# Patient Record
Sex: Female | Born: 1935 | Race: White | Hispanic: No | Smoking: Never smoker
Health system: Southern US, Community
[De-identification: ages and names within clinical notes are randomized; demographics above are authoritative.]

## PROBLEM LIST (undated history)

## (undated) DIAGNOSIS — E119 Type 2 diabetes mellitus without complications: Secondary | ICD-10-CM

---

## 2014-05-13 ENCOUNTER — Ambulatory Visit: Payer: Self-pay | Admitting: Family Medicine

## 2014-05-17 ENCOUNTER — Other Ambulatory Visit: Payer: Self-pay | Admitting: Family Medicine

## 2014-05-17 DIAGNOSIS — R1310 Dysphagia, unspecified: Secondary | ICD-10-CM

## 2014-05-20 ENCOUNTER — Other Ambulatory Visit: Payer: Self-pay

## 2014-05-22 ENCOUNTER — Ambulatory Visit
Admission: RE | Admit: 2014-05-22 | Discharge: 2014-05-22 | Disposition: A | Discharge status: Home / Self Care | Payer: Commercial Managed Care - HMO | Source: Ambulatory Visit | Attending: Family Medicine | Admitting: Family Medicine

## 2014-05-22 DIAGNOSIS — R1310 Dysphagia, unspecified: Secondary | ICD-10-CM

## 2016-07-20 ENCOUNTER — Ambulatory Visit: Payer: Commercial Managed Care - HMO | Admitting: Adult Health

## 2016-07-23 ENCOUNTER — Ambulatory Visit: Payer: Commercial Managed Care - HMO | Admitting: Adult Health

## 2019-11-30 ENCOUNTER — Encounter: Payer: Self-pay | Admitting: *Deleted

## 2019-11-30 ENCOUNTER — Observation Stay (HOSPITAL_COMMUNITY): Payer: Medicare HMO

## 2019-11-30 ENCOUNTER — Emergency Department (HOSPITAL_COMMUNITY): Payer: Medicare HMO

## 2019-11-30 ENCOUNTER — Inpatient Hospital Stay (HOSPITAL_COMMUNITY)
Admission: EM | Admit: 2019-11-30 | Discharge: 2019-12-08 | DRG: 177 | Disposition: A | Discharge status: Home Health | Payer: Medicare HMO | Attending: Internal Medicine | Admitting: Internal Medicine

## 2019-11-30 DIAGNOSIS — E860 Dehydration: Secondary | ICD-10-CM | POA: Diagnosis present

## 2019-11-30 DIAGNOSIS — B370 Candidal stomatitis: Secondary | ICD-10-CM | POA: Diagnosis not present

## 2019-11-30 DIAGNOSIS — E441 Mild protein-calorie malnutrition: Secondary | ICD-10-CM | POA: Diagnosis present

## 2019-11-30 DIAGNOSIS — E871 Hypo-osmolality and hyponatremia: Secondary | ICD-10-CM

## 2019-11-30 DIAGNOSIS — U071 COVID-19: Secondary | ICD-10-CM | POA: Diagnosis not present

## 2019-11-30 DIAGNOSIS — E785 Hyperlipidemia, unspecified: Secondary | ICD-10-CM | POA: Diagnosis present

## 2019-11-30 DIAGNOSIS — G4700 Insomnia, unspecified: Secondary | ICD-10-CM | POA: Diagnosis present

## 2019-11-30 DIAGNOSIS — S065X9A Traumatic subdural hemorrhage with loss of consciousness of unspecified duration, initial encounter: Secondary | ICD-10-CM

## 2019-11-30 DIAGNOSIS — D6489 Other specified anemias: Secondary | ICD-10-CM | POA: Diagnosis not present

## 2019-11-30 DIAGNOSIS — R627 Adult failure to thrive: Secondary | ICD-10-CM

## 2019-11-30 DIAGNOSIS — Y92003 Bedroom of unspecified non-institutional (private) residence as the place of occurrence of the external cause: Secondary | ICD-10-CM

## 2019-11-30 DIAGNOSIS — R29898 Other symptoms and signs involving the musculoskeletal system: Secondary | ICD-10-CM

## 2019-11-30 DIAGNOSIS — N179 Acute kidney failure, unspecified: Secondary | ICD-10-CM | POA: Diagnosis present

## 2019-11-30 DIAGNOSIS — E876 Hypokalemia: Secondary | ICD-10-CM | POA: Diagnosis not present

## 2019-11-30 DIAGNOSIS — D6959 Other secondary thrombocytopenia: Secondary | ICD-10-CM | POA: Diagnosis not present

## 2019-11-30 DIAGNOSIS — R634 Abnormal weight loss: Secondary | ICD-10-CM

## 2019-11-30 DIAGNOSIS — R630 Anorexia: Secondary | ICD-10-CM

## 2019-11-30 DIAGNOSIS — E119 Type 2 diabetes mellitus without complications: Secondary | ICD-10-CM

## 2019-11-30 DIAGNOSIS — S065X0A Traumatic subdural hemorrhage without loss of consciousness, initial encounter: Secondary | ICD-10-CM | POA: Diagnosis present

## 2019-11-30 DIAGNOSIS — K117 Disturbances of salivary secretion: Secondary | ICD-10-CM

## 2019-11-30 DIAGNOSIS — I6200 Nontraumatic subdural hemorrhage, unspecified: Secondary | ICD-10-CM

## 2019-11-30 DIAGNOSIS — Z7189 Other specified counseling: Secondary | ICD-10-CM

## 2019-11-30 DIAGNOSIS — M6281 Muscle weakness (generalized): Secondary | ICD-10-CM | POA: Diagnosis present

## 2019-11-30 DIAGNOSIS — I10 Essential (primary) hypertension: Secondary | ICD-10-CM | POA: Diagnosis present

## 2019-11-30 DIAGNOSIS — Z7984 Long term (current) use of oral hypoglycemic drugs: Secondary | ICD-10-CM

## 2019-11-30 DIAGNOSIS — E872 Acidosis: Secondary | ICD-10-CM | POA: Diagnosis present

## 2019-11-30 DIAGNOSIS — E11649 Type 2 diabetes mellitus with hypoglycemia without coma: Secondary | ICD-10-CM | POA: Diagnosis not present

## 2019-11-30 DIAGNOSIS — W06XXXA Fall from bed, initial encounter: Secondary | ICD-10-CM | POA: Diagnosis present

## 2019-11-30 DIAGNOSIS — Z79899 Other long term (current) drug therapy: Secondary | ICD-10-CM

## 2019-11-30 DIAGNOSIS — S065XAA Traumatic subdural hemorrhage with loss of consciousness status unknown, initial encounter: Secondary | ICD-10-CM

## 2019-11-30 DIAGNOSIS — Z515 Encounter for palliative care: Secondary | ICD-10-CM

## 2019-11-30 DIAGNOSIS — H9190 Unspecified hearing loss, unspecified ear: Secondary | ICD-10-CM | POA: Diagnosis present

## 2019-11-30 HISTORY — DX: Type 2 diabetes mellitus without complications: E11.9

## 2019-11-30 LAB — POCT I-STAT EG7
Acid-base deficit: 5 mmol/L — ABNORMAL HIGH (ref 0.0–2.0)
Bicarbonate: 21 mmol/L (ref 20.0–28.0)
Calcium, Ion: 1.08 mmol/L — ABNORMAL LOW (ref 1.15–1.40)
HCT: 38 % (ref 36.0–46.0)
Hemoglobin: 12.9 g/dL (ref 12.0–15.0)
O2 Saturation: 69 %
Potassium: 5 mmol/L (ref 3.5–5.1)
Sodium: 131 mmol/L — ABNORMAL LOW (ref 135–145)
TCO2: 22 mmol/L (ref 22–32)
pCO2, Ven: 41.1 mmHg — ABNORMAL LOW (ref 44.0–60.0)
pH, Ven: 7.316 (ref 7.250–7.430)
pO2, Ven: 39 mmHg (ref 32.0–45.0)

## 2019-11-30 LAB — COMPREHENSIVE METABOLIC PANEL
ALT: 11 U/L (ref 0–44)
AST: 22 U/L (ref 15–41)
Albumin: 3.7 g/dL (ref 3.5–5.0)
Alkaline Phosphatase: 54 U/L (ref 38–126)
Anion gap: 16 — ABNORMAL HIGH (ref 5–15)
BUN: 40 mg/dL — ABNORMAL HIGH (ref 8–23)
CO2: 20 mmol/L — ABNORMAL LOW (ref 22–32)
Calcium: 9.2 mg/dL (ref 8.9–10.3)
Chloride: 97 mmol/L — ABNORMAL LOW (ref 98–111)
Creatinine, Ser: 1.07 mg/dL — ABNORMAL HIGH (ref 0.44–1.00)
GFR calc Af Amer: 54 mL/min — ABNORMAL LOW (ref >60)
GFR calc non Af Amer: 47 mL/min — ABNORMAL LOW (ref >60)
Glucose, Bld: 102 mg/dL — ABNORMAL HIGH (ref 70–99)
Potassium: 5.1 mmol/L (ref 3.5–5.1)
Sodium: 133 mmol/L — ABNORMAL LOW (ref 135–145)
Total Bilirubin: 0.8 mg/dL (ref 0.3–1.2)
Total Protein: 7.5 g/dL (ref 6.5–8.1)

## 2019-11-30 LAB — URINALYSIS, ROUTINE W REFLEX MICROSCOPIC
Bacteria, UA: NONE SEEN
Bilirubin Urine: NEGATIVE
Glucose, UA: >=500 mg/dL — AB
Hgb urine dipstick: NEGATIVE
Ketones, ur: 5 mg/dL — AB
Leukocytes,Ua: NEGATIVE
Nitrite: NEGATIVE
Protein, ur: NEGATIVE mg/dL
Specific Gravity, Urine: 1.016 (ref 1.005–1.030)
pH: 5 (ref 5.0–8.0)

## 2019-11-30 LAB — LACTIC ACID, PLASMA: Lactic Acid, Venous: 1.3 mmol/L (ref 0.5–1.9)

## 2019-11-30 LAB — CBC WITH DIFFERENTIAL/PLATELET
Abs Immature Granulocytes: 0.01 10*3/uL (ref 0.00–0.07)
Basophils Absolute: 0 10*3/uL (ref 0.0–0.1)
Basophils Relative: 0 %
Eosinophils Absolute: 0 10*3/uL (ref 0.0–0.5)
Eosinophils Relative: 0 %
HCT: 41.6 % (ref 36.0–46.0)
Hemoglobin: 13.7 g/dL (ref 12.0–15.0)
Immature Granulocytes: 0 %
Lymphocytes Relative: 12 %
Lymphs Abs: 0.5 10*3/uL — ABNORMAL LOW (ref 0.7–4.0)
MCH: 29.5 pg (ref 26.0–34.0)
MCHC: 32.9 g/dL (ref 30.0–36.0)
MCV: 89.5 fL (ref 80.0–100.0)
Monocytes Absolute: 0.4 10*3/uL (ref 0.1–1.0)
Monocytes Relative: 10 %
Neutro Abs: 3.3 10*3/uL (ref 1.7–7.7)
Neutrophils Relative %: 78 %
Platelets: 163 10*3/uL (ref 150–400)
RBC: 4.65 MIL/uL (ref 3.87–5.11)
RDW: 12.8 % (ref 11.5–15.5)
WBC: 4.3 10*3/uL (ref 4.0–10.5)
nRBC: 0 % (ref 0.0–0.2)

## 2019-11-30 LAB — CBG MONITORING, ED: Glucose-Capillary: 100 mg/dL — ABNORMAL HIGH (ref 70–99)

## 2019-11-30 LAB — LIPASE, BLOOD: Lipase: 36 U/L (ref 11–51)

## 2019-11-30 MED ORDER — ENALAPRIL MALEATE 5 MG PO TABS
10.0000 mg | ORAL_TABLET | Freq: Every day | ORAL | Status: DC
  Administered 2019-12-01 – 2019-12-08 (×8): 10 mg via ORAL
  Filled 2019-11-30 (×9): qty 2

## 2019-11-30 MED ORDER — IOHEXOL 350 MG/ML SOLN
100.0000 mL | Freq: Once | INTRAVENOUS | Status: AC | PRN
  Administered 2019-11-30: 100 mL via INTRAVENOUS

## 2019-11-30 MED ORDER — GEMFIBROZIL 600 MG PO TABS
600.0000 mg | ORAL_TABLET | Freq: Two times a day (BID) | ORAL | Status: DC
  Administered 2019-12-01 – 2019-12-08 (×16): 600 mg via ORAL
  Filled 2019-11-30 (×18): qty 1

## 2019-11-30 MED ORDER — MONTELUKAST SODIUM 10 MG PO TABS
10.0000 mg | ORAL_TABLET | Freq: Every day | ORAL | Status: DC
  Administered 2019-12-01 – 2019-12-07 (×8): 10 mg via ORAL
  Filled 2019-11-30 (×9): qty 1

## 2019-11-30 MED ORDER — LACTATED RINGERS IV BOLUS
1000.0000 mL | Freq: Once | INTRAVENOUS | Status: AC
  Administered 2019-11-30: 1000 mL via INTRAVENOUS

## 2019-11-30 MED ORDER — PRAVASTATIN SODIUM 40 MG PO TABS
80.0000 mg | ORAL_TABLET | Freq: Every day | ORAL | Status: DC
  Administered 2019-12-01 – 2019-12-07 (×6): 80 mg via ORAL
  Filled 2019-11-30 (×7): qty 2

## 2019-11-30 MED ORDER — SODIUM CHLORIDE 0.9 % IV SOLN: INTRAVENOUS | Status: DC

## 2019-11-30 MED ORDER — ONDANSETRON HCL 4 MG/2ML IJ SOLN
4.0000 mg | Freq: Once | INTRAMUSCULAR | Status: DC
  Filled 2019-11-30: qty 2

## 2019-11-30 NOTE — ED Triage Notes (Signed)
Pt states that she is feeling sick and has been sick for 3 weeks.  She states that she can not eat, denies any vomiting. Pt states that she has daily BM. When asked if it hurts when she voids she says "yes".  She deneis any abdominal pain.  Attempt to call her daughter to get additional information, no answer.

## 2019-11-30 NOTE — H&P (Signed)
History and Physical    Mckenzie Rodriguez ZOX:[id number] DOB: 11/09/1931 DOA: 11/30/2019  Patient coming from: home   Chief Complaint: unintentional weight loss  HPI: Mckenzie Rodriguez is a 86 y.o. female with medical history significant for t2dm presents with above.  Bulk of history obtained from patient's daughter. Patient lives alone. For at least 3 weeks she has complained of food not tasting good, tasting bitter. Such that she does not want to eat or drink much. Has lost around 10 pounds. No pain with swallowing or difficulty swallowing. No pain anywhere, no fevers or cough. No dysuria or hematuria. Thinks had a normal colonoscopy about 10 years ago. No blood in stool. Fell off bed about a week ago and bumped head. No lesion.  Saw new PCP a few days ago, prescribed mirtazapine for appetite and insomnia.   ED Course: ct head found small subdural hematoma, neurosurg consulted. Labs.  Review of Systems: As per HPI otherwise 10 point review of systems negative.    Past Medical History:  Diagnosis Date  . Diabetes mellitus without complication (HCC)     History reviewed. No pertinent surgical history.   reports that she does not drink alcohol or use drugs. No history on file for tobacco.  No Known Allergies  No family history on file.  Prior to Admission medications   Medication Sig Start Date End Date Taking? Authorizing Provider  enalapril (VASOTEC) 10 MG tablet Take 10 mg by mouth daily. 09/12/19  Yes [provider]  gemfibrozil (LOPID) 600 MG tablet Take 600 mg by mouth 2 (two) times daily. 11/21/19  Yes [provider]  INVOKANA 100 MG TABS tablet Take 100 mg by mouth daily. 09/12/19  Yes [provider]  metFORMIN (GLUCOPHAGE) 500 MG tablet Take 500 mg by mouth 2 (two) times daily. 10/12/19  Yes [provider]  mirtazapine (REMERON SOL-TAB) 15 MG disintegrating tablet Take 15 mg by mouth at bedtime. 11/28/19  Yes [provider]    montelukast (SINGULAIR) 10 MG tablet Take 10 mg by mouth at bedtime. 10/12/19  Yes [provider]  pravastatin (PRAVACHOL) 80 MG tablet Take 80 mg by mouth daily. 09/18/19  Yes [provider]    Physical Exam: Vitals:   11/30/19 1715 11/30/19 1850 11/30/19 1859 11/30/19 1900  BP: 123/68 (!) 142/83  (!) 147/80  Pulse:  (!) 104  (!) 102  Resp: 19 19 20    Temp:      TempSrc:      SpO2:  97%  95%    Constitutional: No acute distress Head: Atraumatic Eyes: Conjunctiva clear ENM: Moist mucous membranes. Normal dentition.  Neck: Supple Respiratory: Clear to auscultation bilaterally, no wheezing/rales/rhonchi. Normal respiratory effort. No accessory muscle use. . Cardiovascular: Regular rate and rhythm. Distant heart sounds. Soft systolic murmur Abdomen: Non-tender, non-distended. No masses. No rebound or guarding. Positive bowel sounds. Musculoskeletal: No joint deformity upper and lower extremities. Normal ROM, no contractures. Normal muscle tone.  Skin: No rashes, lesions, or ulcers.  Extremities: No peripheral edema. Palpable peripheral pulses. Neurologic: Alert, moving all 4 extremities. Psychiatric: a bit confused   Labs on Admission: I have personally reviewed following labs and imaging studies  CBC: Recent Labs  Lab 11/30/19 1242 11/30/19 1903  WBC 4.3  --   NEUTROABS 3.3  --   HGB 13.7 12.9  HCT 41.6 38.0  MCV 89.5  --   PLT 163  --    Basic Metabolic Panel: Recent Labs  Lab 11/30/19  1242 11/30/19 1903  NA 133* 131*  K 5.1 5.0  CL 97*  --   CO2 20*  --   GLUCOSE 102*  --   BUN 40*  --   CREATININE 1.07*  --   CALCIUM 9.2  --    GFR: CrCl cannot be calculated (Unknown ideal weight.). Liver Function Tests: Recent Labs  Lab 11/30/19 1242  AST 22  ALT 11  ALKPHOS 54  BILITOT 0.8  PROT 7.5  ALBUMIN 3.7   Recent Labs  Lab 11/30/19 1242  LIPASE 36   No results for input(s): AMMONIA in the last 168 hours. Coagulation  Profile: No results for input(s): INR, PROTIME in the last 168 hours. Cardiac Enzymes: No results for input(s): CKTOTAL, CKMB, CKMBINDEX, TROPONINI in the last 168 hours. BNP (last 3 results) No results for input(s): PROBNP in the last 8760 hours. HbA1C: No results for input(s): HGBA1C in the last 72 hours. CBG: Recent Labs  Lab 11/30/19 1241  GLUCAP 100*   Lipid Profile: No results for input(s): CHOL, HDL, LDLCALC, TRIG, CHOLHDL, LDLDIRECT in the last 72 hours. Thyroid Function Tests: No results for input(s): TSH, T4TOTAL, FREET4, T3FREE, THYROIDAB in the last 72 hours. Anemia Panel: No results for input(s): VITAMINB12, FOLATE, FERRITIN, TIBC, IRON, RETICCTPCT in the last 72 hours. Urine analysis:    Component Value Date/Time   COLORURINE YELLOW 11/30/2019 1242   APPEARANCEUR HAZY (A) 11/30/2019 1242   LABSPEC 1.016 11/30/2019 1242   PHURINE 5.0 11/30/2019 1242   GLUCOSEU >=500 (A) 11/30/2019 1242   HGBUR NEGATIVE 11/30/2019 1242   BILIRUBINUR NEGATIVE 11/30/2019 1242   KETONESUR 5 (A) 11/30/2019 1242   PROTEINUR NEGATIVE 11/30/2019 1242   NITRITE NEGATIVE 11/30/2019 1242   LEUKOCYTESUR NEGATIVE 11/30/2019 1242    Radiological Exams on Admission: CT Head Wo Contrast  Result Date: 11/30/2019 CLINICAL DATA:  Ataxia. EXAM: CT HEAD WITHOUT CONTRAST TECHNIQUE: Contiguous axial images were obtained from the base of the skull through the vertex without intravenous contrast. COMPARISON:  None. FINDINGS: Brain: A small acute subdural bleed is seen along the left frontal lobe. This measures approximately 6 mm in maximum thickness (axial CT images 18 through 24, CT series number 2). There is very mild mass effect on the adjacent sulci. No midline shift is seen. There is mild cerebral atrophy with widening of the extra-axial spaces and ventricular dilatation. There are areas of decreased attenuation within the white matter tracts of the supratentorial brain, consistent with microvascular  disease changes. Vascular: No hyperdense vessel or unexpected calcification. Skull: Normal. Negative for fracture or focal lesion. Sinuses/Orbits: No acute finding. Other: None. IMPRESSION: 1. Small acute left frontal lobe subdural bleed. 2. 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 Electronically Signed   By: Aram Candela M.D.   On: 11/30/2019 18:18   CT ABDOMEN PELVIS W CONTRAST  Result Date: 11/30/2019 CLINICAL DATA:  Feeling sick for 3 weeks. EXAM: CT ABDOMEN AND PELVIS WITH CONTRAST TECHNIQUE: Multidetector CT imaging of the abdomen and pelvis was performed using the standard protocol following bolus administration of intravenous contrast. CONTRAST:  OMNIPAQUE IOHEXOL 350 MG/ML SOLN COMPARISON:  None. FINDINGS: Lower chest: Hazy left lower lobe airspace disease which may be secondary to an infectious or inflammatory etiology. Distal esophageal wall thickening as can be seen with esophagitis. Hepatobiliary: Diffuse low attenuation of the liver as can be seen with hepatic steatosis. Normal gallbladder. No hepatic mass. Pancreas: Unremarkable. No pancreatic ductal dilatation or surrounding inflammatory changes. Spleen:  Normal in size without focal abnormality. Adrenals/Urinary Tract: Normal adrenal glands. 4 cm hypodense, fluid attenuating left renal mass. Bilateral nephrolithiasis. No ureterolithiasis. Normal bladder. Stomach/Bowel: Relative thickening of the wall of the pylorus likely reflecting peristalsis. No bowel dilatation or bowel wall thickening. No pneumatosis, pneumoperitoneum or portal venous gas. Diverticulosis without evidence of diverticulitis. Vascular/Lymphatic: Normal caliber abdominal aorta with mild atherosclerosis. No lymphadenopathy. Reproductive: Uterus and bilateral adnexa are unremarkable. Other: No abdominal wall hernia or abnormality. No abdominopelvic ascites. Musculoskeletal: No acute osseous abnormality. No aggressive osseous  lesion. Severe degenerative disease with disc height loss at L3-4 with reactive endplate sclerosis. Bilateral facet arthropathy of the lumbar spine. Chronic L3 vertebral body compression fracture. IMPRESSION: 1. No acute abdominal or pelvic pathology. 2. Hazy left lower lobe airspace disease which may be secondary to an infectious or inflammatory etiology. 3. Hepatic steatosis 4. Thickening of the distal esophageal wall as can be seen with esophagitis. Electronically Signed   By: Kathreen Devoid   On: 11/30/2019 18:29     Assessment/Plan Principal Problem:   Subdural hematoma (HCC) Active Problems:   T2DM (type 2 diabetes mellitus) ([place])   Decreased appetite   Unintentional weight loss   Hyponatremia   # Subdural hematoma - likely 2/2 fall one week ago. Unlikely the cause of other problems as they started several weeks prior to fall and would not expect subdural hematoma to specifically target one's appetite. Appreciate neurosurg recs - q2 neuro checks - npo overnight - repeat CT in morning, likely d/c if stable  # Unintentional weight loss - daughter thinks this could be stress as is about to move in with the daughter, and last time patient had a big move (from [place] to [place]) went through a similar period of decreased appetite and wt loss. History and exam otherwise non-focal.  - given primarily gi symptoms and thickening of pyloris on ct, consider outpt gi f/u - will check hif, tsh, hcv, occult blood, crp, and cxr - likely continued outpatient w/u - f/u covid test  # t2dm - here glucose wnl - daily fasting sugars, hold invokana and metformin - a1c  # Elevated bp - on enalapril, but daughter says no hx htn, enalapril is likely for kidney protection - monitor  # Elevated cr - no baseline for comparison. Mildly elevated to 1.07, bun also elevated, in setting of reduced po. Possibly degree of prerenal aki. - gentle fluids, repeat kidney function in AM  # hyponatremia - mild, unlikely sig  contributor to current symptoms - urine osm and sodium, tsh  DVT prophylaxis: scds, holding heparin given intracranial bleeding Code Status: full code  Family Communication: daughter  Disposition Plan: tbd  Consults called: neurosurgery  Admission status: obs    Desma Maxim MD [facility] Pager [phone]  If 7PM-7AM, please contact night-coverage [email or web address] Password [facility]  11/30/2019, 10:03 PM

## 2019-11-30 NOTE — Consult Note (Addendum)
Chief Complaint   Chief Complaint  Patient presents with  . Illness    HPI   Consult requested by: Dr Day, EDP Reason for consult: SDH  HPI: Mckenzie Rodriguez is a 86 y.o. female with history of diabetes who presented to the ED complaining of 3 week history of "not feeling well" and lack of appetite. She is a relatively poor historian and teasing through some of the details of her symptoms is quite difficult. She does endorses 20lb weight loss over the last 3 weeks due to poor appetite as well as generalized weakness. She did fall at some point over the last  3 weeks, possible before symptoms started, but she is not sure. She did hit head, no LOC. Did suffer bruising on face which has resolved. EDP was able to talk to daughter who states symptoms started before the fall. A CT head was obtained which revealed small left parietal SDH. NSY consultation requested. She denies any pain, N/T, dizziness. Not on blood thinners.  There are no problems to display for this patient.   PMH: Past Medical History:  Diagnosis Date  . Diabetes mellitus without complication (HCC)     PSH: History reviewed. No pertinent surgical history.  (Not in a hospital admission)   SH: Social History   Tobacco Use  . Smoking status: Not on file  Substance Use Topics  . Alcohol use: Never  . Drug use: Never    MEDS: Prior to Admission medications   Medication Sig Start Date End Date Taking? Authorizing Provider  enalapril (VASOTEC) 10 MG tablet Take 10 mg by mouth daily. 09/12/19  Yes [provider]  gemfibrozil (LOPID) 600 MG tablet Take 600 mg by mouth 2 (two) times daily. 11/21/19  Yes [provider]  INVOKANA 100 MG TABS tablet Take 100 mg by mouth daily. 09/12/19  Yes [provider]  metFORMIN (GLUCOPHAGE) 500 MG tablet Take 500 mg by mouth 2 (two) times daily. 10/12/19  Yes [provider]  mirtazapine (REMERON SOL-TAB) 15 MG disintegrating tablet Take 15 mg  by mouth at bedtime. 11/28/19  Yes [provider]  montelukast (SINGULAIR) 10 MG tablet Take 10 mg by mouth at bedtime. 10/12/19  Yes [provider]  pravastatin (PRAVACHOL) 80 MG tablet Take 80 mg by mouth daily. 09/18/19  Yes [provider]    ALLERGY: No Known Allergies  Social History   Tobacco Use  . Smoking status: Not on file  Substance Use Topics  . Alcohol use: Never     No family history on file.   ROS   Review of Systems  Constitutional: Negative.   HENT: Negative.   Eyes: Negative.   Cardiovascular: Negative.   Gastrointestinal: Positive for nausea.  Genitourinary: Negative.   Musculoskeletal: Negative.   Skin: Negative.   Neurological: Positive for weakness. Negative for dizziness, tingling, tremors, sensory change, speech change, focal weakness, loss of consciousness and headaches.    Exam   Vitals:   11/30/19 1859 11/30/19 1900  BP:  (!) 147/80  Pulse:  (!) 102  Resp: 20   Temp:    SpO2:  95%   General appearance: elderly female, NAD Eyes: No scleral injection Cardiovascular: Regular rate and rhythm without murmurs, rubs, gallops. No edema or variciosities. Distal pulses normal. Pulmonary: Effort normal, non-labored breathing Musculoskeletal:     Muscle tone upper extremities: Normal    Muscle tone lower extremities: Normal    Motor exam: Upper Extremities Deltoid Bicep Tricep Grip  Right 5/5 5/5 5/5 5/5  Left 5/5 5/5 5/5 5/5   Lower Extremity IP Quad PF DF EHL  Right 5/5 5/5 5/5 5/5 5/5  Left 5/5 5/5 5/5 5/5 5/5   Neurological Mental Status:    - Patient is awake, alert, oriented to self, but not location/year    - Patient is able to give a clear and coherent history.    - No signs of aphasia or neglect Cranial Nerves    - II: Visual Fields are full. PERRL    - III/IV/VI: EOMI without ptosis or diploplia.     - V: Facial sensation is grossly normal    - VII: Facial movement is symmetric.     - VIII:  hearing is intact to voice    - X: Uvula elevates symmetrically    - XI: Shoulder shrug is symmetric.    - XII: tongue is midline without atrophy or fasciculations.  Sensory: Sensation grossly intact to LT  Results - Imaging/Labs   Results for orders placed or performed during the hospital encounter of 11/30/19 (from the past 48 hour(s))  CBG monitoring, ED     Status: Abnormal   Collection Time: 11/30/19 12:41 PM  Result Value Ref Range   Glucose-Capillary 100 (H) 70 - 99 mg/dL  Comprehensive metabolic panel     Status: Abnormal   Collection Time: 11/30/19 12:42 PM  Result Value Ref Range   Sodium 133 (L) 135 - 145 mmol/L   Potassium 5.1 3.5 - 5.1 mmol/L   Chloride 97 (L) 98 - 111 mmol/L   CO2 20 (L) 22 - 32 mmol/L   Glucose, Bld 102 (H) 70 - 99 mg/dL   BUN 40 (H) 8 - 23 mg/dL   Creatinine, Ser 7.26 (H) 0.44 - 1.00 mg/dL   Calcium 9.2 8.9 - 20.3 mg/dL   Total Protein 7.5 6.5 - 8.1 g/dL   Albumin 3.7 3.5 - 5.0 g/dL   AST 22 15 - 41 U/L   ALT 11 0 - 44 U/L   Alkaline Phosphatase 54 38 - 126 U/L   Total Bilirubin 0.8 0.3 - 1.2 mg/dL   GFR calc non Af Amer 47 (L) >60 mL/min   GFR calc Af Amer 54 (L) >60 mL/min   Anion gap 16 (H) 5 - 15    Comment: Performed at [facility], [address] [place], [place] [address]  Lipase, blood     Status: None   Collection Time: 11/30/19 12:42 PM  Result Value Ref Range   Lipase 36 11 - 51 U/L    Comment: Performed at [facility], [address], [place], [place] [address]  CBC with Diff     Status: Abnormal   Collection Time: 11/30/19 12:42 PM  Result Value Ref Range   WBC 4.3 4.0 - 10.5 K/uL   RBC 4.65 3.87 - 5.11 MIL/uL   Hemoglobin 13.7 12.0 - 15.0 g/dL   HCT 53.6 46.8 - 03.2 %   MCV 89.5 80.0 - 100.0 fL   MCH 29.5 26.0 - 34.0 pg   MCHC 32.9 30.0 - 36.0 g/dL   RDW 12.2 48.2 - 50.0 %   Platelets 163 150 - 400 K/uL   nRBC 0.0 0.0 - 0.2 %   Neutrophils Relative % 78 %   Neutro Abs 3.3 1.7 - 7.7 K/uL   Lymphocytes  Relative 12 %   Lymphs Abs 0.5 (L) 0.7 - 4.0 K/uL   Monocytes Relative 10 %  Monocytes Absolute 0.4 0.1 - 1.0 K/uL   Eosinophils Relative 0 %   Eosinophils Absolute 0.0 0.0 - 0.5 K/uL   Basophils Relative 0 %   Basophils Absolute 0.0 0.0 - 0.1 K/uL   Immature Granulocytes 0 %   Abs Immature Granulocytes 0.01 0.00 - 0.07 K/uL    Comment: Performed at [facility], [address], [place], [place] [address]  Urinalysis, Routine w reflex microscopic     Status: Abnormal   Collection Time: 11/30/19 12:42 PM  Result Value Ref Range   Color, Urine YELLOW YELLOW   APPearance HAZY (A) CLEAR   Specific Gravity, Urine 1.016 1.005 - 1.030   pH 5.0 5.0 - 8.0   Glucose, UA >=500 (A) NEGATIVE mg/dL   Hgb urine dipstick NEGATIVE NEGATIVE   Bilirubin Urine NEGATIVE NEGATIVE   Ketones, ur 5 (A) NEGATIVE mg/dL   Protein, ur NEGATIVE NEGATIVE mg/dL   Nitrite NEGATIVE NEGATIVE   Leukocytes,Ua NEGATIVE NEGATIVE   RBC / HPF 0-5 0 - 5 RBC/hpf   WBC, UA 0-5 0 - 5 WBC/hpf   Bacteria, UA NONE SEEN NONE SEEN   Squamous Epithelial / LPF 0-5 0 - 5   Mucus PRESENT    Hyaline Casts, UA PRESENT     Comment: Performed at [facility], [address] [place], [place] [address]  POCT I-Stat EG7     Status: Abnormal   Collection Time: 11/30/19  7:03 PM  Result Value Ref Range   pH, Ven 7.316 7.250 - 7.430   pCO2, Ven 41.1 (L) 44.0 - 60.0 mmHg   pO2, Ven 39.0 32.0 - 45.0 mmHg   Bicarbonate 21.0 20.0 - 28.0 mmol/L   TCO2 22 22 - 32 mmol/L   O2 Saturation 69.0 %   Acid-base deficit 5.0 (H) 0.0 - 2.0 mmol/L   Sodium 131 (L) 135 - 145 mmol/L   Potassium 5.0 3.5 - 5.1 mmol/L   Calcium, Ion 1.08 (L) 1.15 - 1.40 mmol/L   HCT 38.0 36.0 - 46.0 %   Hemoglobin 12.9 12.0 - 15.0 g/dL   Patient temperature HIDE    Sample type VENOUS    Comment NOTIFIED PHYSICIAN     CT Head Wo Contrast  Result Date: 11/30/2019 CLINICAL DATA:  Ataxia. EXAM: CT HEAD WITHOUT CONTRAST TECHNIQUE: Contiguous axial images  were obtained from the base of the skull through the vertex without intravenous contrast. COMPARISON:  None. FINDINGS: Brain: A small acute subdural bleed is seen along the left frontal lobe. This measures approximately 6 mm in maximum thickness (axial CT images 18 through 24, CT series number 2). There is very mild mass effect on the adjacent sulci. No midline shift is seen. There is mild cerebral atrophy with widening of the extra-axial spaces and ventricular dilatation. There are areas of decreased attenuation within the white matter tracts of the supratentorial brain, consistent with microvascular disease changes. Vascular: No hyperdense vessel or unexpected calcification. Skull: Normal. Negative for fracture or focal lesion. Sinuses/Orbits: No acute finding. Other: None. IMPRESSION: 1. Small acute left frontal lobe subdural bleed. 2. 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 Electronically Signed   By: Aram Candela M.D.   On: 11/30/2019 18:18   CT ABDOMEN PELVIS W CONTRAST  Result Date: 11/30/2019 CLINICAL DATA:  Feeling sick for 3 weeks. EXAM: CT ABDOMEN AND PELVIS WITH CONTRAST TECHNIQUE: Multidetector CT imaging of the abdomen and pelvis was performed using the standard protocol following bolus administration of  intravenous contrast. CONTRAST:  OMNIPAQUE IOHEXOL 350 MG/ML SOLN COMPARISON:  None. FINDINGS: Lower chest: Hazy left lower lobe airspace disease which may be secondary to an infectious or inflammatory etiology. Distal esophageal wall thickening as can be seen with esophagitis. Hepatobiliary: Diffuse low attenuation of the liver as can be seen with hepatic steatosis. Normal gallbladder. No hepatic mass. Pancreas: Unremarkable. No pancreatic ductal dilatation or surrounding inflammatory changes. Spleen: Normal in size without focal abnormality. Adrenals/Urinary Tract: Normal adrenal glands. 4 cm hypodense, fluid attenuating left renal mass.  Bilateral nephrolithiasis. No ureterolithiasis. Normal bladder. Stomach/Bowel: Relative thickening of the wall of the pylorus likely reflecting peristalsis. No bowel dilatation or bowel wall thickening. No pneumatosis, pneumoperitoneum or portal venous gas. Diverticulosis without evidence of diverticulitis. Vascular/Lymphatic: Normal caliber abdominal aorta with mild atherosclerosis. No lymphadenopathy. Reproductive: Uterus and bilateral adnexa are unremarkable. Other: No abdominal wall hernia or abnormality. No abdominopelvic ascites. Musculoskeletal: No acute osseous abnormality. No aggressive osseous lesion. Severe degenerative disease with disc height loss at L3-4 with reactive endplate sclerosis. Bilateral facet arthropathy of the lumbar spine. Chronic L3 vertebral body compression fracture. IMPRESSION: 1. No acute abdominal or pelvic pathology. 2. Hazy left lower lobe airspace disease which may be secondary to an infectious or inflammatory etiology. 3. Hepatic steatosis 4. Thickening of the distal esophageal wall as can be seen with esophagitis. Electronically Signed   By: Elige Ko   On: 11/30/2019 18:29   Impression/Plan   86 y.o. female with generalized weakness, poor appetite, "not feeling well" & ?10 lbs weight loss over the last 3 weeks. Also fell at some point over the last 3 weeks. Per daughter, fell after sx started. A head CT was obtained which revealed a small, left parietal SDH with localized mass effect but no MLS. No role for NS intervention for this small SDH.  In the setting of recent fall with head trauma, this is likely as a result. This small SDH is certainly not the cause for her presenting symptoms.. She is generally weak, but non focal.  Plan is for patient to be admitted for FTT. - neuro check q 2 hours - repeat head CT tomorrow am for monitoring. If stable, cleared for d/c from NS perspective - hold all blood thinning agents  Please call for any concerns.  Cindra Presume, PA-C [facility]

## 2019-11-30 NOTE — ED Notes (Addendum)
Pt maintains SPO2 of 98% when ambulating. Pt ambulated to the RR. Pt complains of feeling tired and not wanting to eat.

## 2019-11-30 NOTE — ED Provider Notes (Signed)
[facility] EMERGENCY DEPARTMENT Provider Note   CSN: [id number] Arrival date & time: 11/30/19  1208     History Chief Complaint  Patient presents with  . Illness    Mckenzie Rodriguez is a 86 y.o. female.  HPI Mckenzie Rodriguez is a 86 y.o. female with a medical history of diabetes who presents to the ED for decreased appetite and not eating for the past three weeks.  She states not eating because food does not taste good.  She is a poor historian.  She saw her PCP 3 days ago for the same problem, her labs including TSH were normal.  She is type II diabetic, her sugars have been within normal range.  She denies any recent illness, fever, chest pain, shortness breath, abdominal pain, nausea, vomiting, diarrhea.  She denies any known cancer.  She has been losing weight with her decreased appetite.      Past Medical History:  Diagnosis Date  . Diabetes mellitus without complication [facility])     Patient Active Problem List   Diagnosis Date Noted  . Subdural hematoma (HCC) 11/30/2019  . T2DM (type 2 diabetes mellitus) (HCC) 11/30/2019  . Decreased appetite 11/30/2019  . Unintentional weight loss 11/30/2019  . Hyponatremia 11/30/2019    History reviewed. No pertinent surgical history.   OB History   No obstetric history on file.     No family history on file.  Social History   Tobacco Use  . Smoking status: Not on file  Substance Use Topics  . Alcohol use: Never  . Drug use: Never    Home Medications Prior to Admission medications   Medication Sig Start Date End Date Taking? Authorizing Provider  enalapril (VASOTEC) 10 MG tablet Take 10 mg by mouth daily. 09/12/19  Yes [provider]  gemfibrozil (LOPID) 600 MG tablet Take 600 mg by mouth 2 (two) times daily. 11/21/19  Yes [provider]  INVOKANA 100 MG TABS tablet Take 100 mg by mouth daily. 09/12/19  Yes [provider]  metFORMIN (GLUCOPHAGE) 500 MG tablet Take 500 mg by  mouth 2 (two) times daily. 10/12/19  Yes [provider]  mirtazapine (REMERON SOL-TAB) 15 MG disintegrating tablet Take 15 mg by mouth at bedtime. 11/28/19  Yes [provider]  montelukast (SINGULAIR) 10 MG tablet Take 10 mg by mouth at bedtime. 10/12/19  Yes [provider]  pravastatin (PRAVACHOL) 80 MG tablet Take 80 mg by mouth daily. 09/18/19  Yes [provider]    Allergies    Patient has no known allergies.  Review of Systems   Review of Systems  Constitutional: Positive for fatigue and unexpected weight change. Negative for chills and fever.  HENT: Negative for ear pain and sore throat.   Eyes: Negative for pain and visual disturbance.  Respiratory: Negative for cough and shortness of breath.   Cardiovascular: Negative for chest pain and palpitations.  Gastrointestinal: Negative for abdominal pain and vomiting.  Genitourinary: Negative for dysuria and hematuria.  Musculoskeletal: Negative for arthralgias and back pain.  Skin: Negative for color change and rash.  Neurological: Negative for seizures and syncope.  All other systems reviewed and are negative.   Physical Exam Updated Vital Signs BP (!) 145/78   Pulse 100   Temp 97.6 F (36.4 C) (Oral)   Resp 19   SpO2 97%   Physical Exam Vitals and nursing note reviewed.  Constitutional:      General: She is in acute distress.  Appearance: Normal appearance. She is well-developed. She is not ill-appearing.     Comments: Female who appears stated age, hard of hearing, mild acute distress, appears fatigued but is alert and nontoxic  HENT:     Head: Normocephalic and atraumatic.     Right Ear: External ear normal.     Left Ear: External ear normal.     Nose: Nose normal. No rhinorrhea.     Mouth/Throat:     Mouth: Mucous membranes are moist.  Eyes:     General:        Right eye: No discharge.        Left eye: No discharge.     Conjunctiva/sclera: Conjunctivae normal.    Cardiovascular:     Rate and Rhythm: Normal rate and regular rhythm.     Pulses: Normal pulses.     Heart sounds: Normal heart sounds. No murmur.  Pulmonary:     Effort: Pulmonary effort is normal. No respiratory distress.     Breath sounds: Normal breath sounds. No wheezing or rales.  Abdominal:     General: Abdomen is flat. There is no distension.     Palpations: Abdomen is soft. There is no mass.     Tenderness: There is no abdominal tenderness. There is no guarding or rebound.  Musculoskeletal:        General: No deformity or signs of injury. Normal range of motion.     Cervical back: Normal range of motion and neck supple.  Skin:    General: Skin is warm and dry.     Capillary Refill: Capillary refill takes less than 2 seconds.     Coloration: Skin is not jaundiced.  Neurological:     General: No focal deficit present.     Mental Status: She is alert. Mental status is at baseline.  Psychiatric:        Mood and Affect: Mood normal.        Behavior: Behavior normal.     ED Results / Procedures / Treatments   Labs (all labs ordered are listed, but only abnormal results are displayed) Labs Reviewed  COMPREHENSIVE METABOLIC PANEL - Abnormal; Notable for the following components:      Result Value   Sodium 133 (*)    Chloride 97 (*)    CO2 20 (*)    Glucose, Bld 102 (*)    BUN 40 (*)    Creatinine, Ser 1.07 (*)    GFR calc non Af Amer 47 (*)    GFR calc Af Amer 54 (*)    Anion gap 16 (*)    All other components within normal limits  CBC WITH DIFFERENTIAL/PLATELET - Abnormal; Notable for the following components:   Lymphs Abs 0.5 (*)    All other components within normal limits  URINALYSIS, ROUTINE W REFLEX MICROSCOPIC - Abnormal; Notable for the following components:   APPearance HAZY (*)    Glucose, UA >=500 (*)    Ketones, ur 5 (*)    All other components within normal limits  CBG MONITORING, ED - Abnormal; Notable for the following components:    Glucose-Capillary 100 (*)    All other components within normal limits  POCT I-STAT EG7 - Abnormal; Notable for the following components:   pCO2, Ven 41.1 (*)    Acid-base deficit 5.0 (*)    Sodium 131 (*)    Calcium, Ion 1.08 (*)    All other components within normal limits  SARS CORONAVIRUS 2 (TAT 6-24 HRS)  LIPASE, BLOOD  LACTIC ACID, PLASMA  HEMOGLOBIN A1C  SODIUM, URINE, RANDOM  OSMOLALITY, URINE  GLUCOSE, RANDOM  TSH  HIV ANTIBODY (ROUTINE TESTING W REFLEX)  HCV AB W REFLEX TO QUANT PCR  OCCULT BLOOD X 1 CARD TO LAB, STOOL  C-REACTIVE PROTEIN  I-STAT VENOUS BLOOD GAS, ED    EKG None  Radiology Chest Xray 2 View  Result Date: 11/30/2019 CLINICAL DATA:  Unintentional weight loss EXAM: CHEST - 2 VIEW COMPARISON:  None. FINDINGS: Cardiac shadow is at the upper limits of normal in size. Aortic calcifications are noted. The lungs are clear. No effusion is noted. No bony abnormality is seen. IMPRESSION: No acute abnormality noted. Aortic Atherosclerosis (ICD10-I70.0). Electronically Signed   By: Inez Catalina M.D.   On: 11/30/2019 23:13   CT Head Wo Contrast  Result Date: 11/30/2019 CLINICAL DATA:  Ataxia. EXAM: CT HEAD WITHOUT CONTRAST TECHNIQUE: Contiguous axial images were obtained from the base of the skull through the vertex without intravenous contrast. COMPARISON:  None. FINDINGS: Brain: A small acute subdural bleed is seen along the left frontal lobe. This measures approximately 6 mm in maximum thickness (axial CT images 18 through 24, CT series number 2). There is very mild mass effect on the adjacent sulci. No midline shift is seen. There is mild cerebral atrophy with widening of the extra-axial spaces and ventricular dilatation. There are areas of decreased attenuation within the white matter tracts of the supratentorial brain, consistent with microvascular disease changes. Vascular: No hyperdense vessel or unexpected calcification. Skull: Normal. Negative for fracture or  focal lesion. Sinuses/Orbits: No acute finding. Other: None. IMPRESSION: 1. Small acute left frontal lobe subdural bleed. 2. 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 Electronically Signed   By: Virgina Norfolk M.D.   On: 11/30/2019 18:18   CT ABDOMEN PELVIS W CONTRAST  Result Date: 11/30/2019 CLINICAL DATA:  Feeling sick for 3 weeks. EXAM: CT ABDOMEN AND PELVIS WITH CONTRAST TECHNIQUE: Multidetector CT imaging of the abdomen and pelvis was performed using the standard protocol following bolus administration of intravenous contrast. CONTRAST:  145mL OMNIPAQUE IOHEXOL 350 MG/ML SOLN COMPARISON:  None. FINDINGS: Lower chest: Hazy left lower lobe airspace disease which may be secondary to an infectious or inflammatory etiology. Distal esophageal wall thickening as can be seen with esophagitis. Hepatobiliary: Diffuse low attenuation of the liver as can be seen with hepatic steatosis. Normal gallbladder. No hepatic mass. Pancreas: Unremarkable. No pancreatic ductal dilatation or surrounding inflammatory changes. Spleen: Normal in size without focal abnormality. Adrenals/Urinary Tract: Normal adrenal glands. 4 cm hypodense, fluid attenuating left renal mass. Bilateral nephrolithiasis. No ureterolithiasis. Normal bladder. Stomach/Bowel: Relative thickening of the wall of the pylorus likely reflecting peristalsis. No bowel dilatation or bowel wall thickening. No pneumatosis, pneumoperitoneum or portal venous gas. Diverticulosis without evidence of diverticulitis. Vascular/Lymphatic: Normal caliber abdominal aorta with mild atherosclerosis. No lymphadenopathy. Reproductive: Uterus and bilateral adnexa are unremarkable. Other: No abdominal wall hernia or abnormality. No abdominopelvic ascites. Musculoskeletal: No acute osseous abnormality. No aggressive osseous lesion. Severe degenerative disease with disc height loss at L3-4 with reactive endplate sclerosis. Bilateral facet  arthropathy of the lumbar spine. Chronic L3 vertebral body compression fracture. IMPRESSION: 1. No acute abdominal or pelvic pathology. 2. Hazy left lower lobe airspace disease which may be secondary to an infectious or inflammatory etiology. 3. Hepatic steatosis 4. Thickening of the distal esophageal wall as can be seen with esophagitis. Electronically Signed   By: Kathreen Devoid   On: 11/30/2019  18:29    Procedures Procedures (including critical care time)  Medications Ordered in ED Medications  enalapril (VASOTEC) tablet 10 mg (has no administration in time range)  gemfibrozil (LOPID) tablet 600 mg (has no administration in time range)  pravastatin (PRAVACHOL) tablet 80 mg (has no administration in time range)  montelukast (SINGULAIR) tablet 10 mg (has no administration in time range)  0.9 %  sodium chloride infusion (has no administration in time range)  lactated ringers bolus 1,000 mL (0 mLs Intravenous Stopped 11/30/19 2129)  iohexol (OMNIPAQUE) 350 MG/ML injection 100 mL (100 mLs Intravenous Contrast Given 11/30/19 1810)    ED Course  I have reviewed the triage vital signs and the nursing notes.  Pertinent labs & imaging results that were available during my care of the patient were reviewed by me and considered in my medical decision making (see chart for details).    MDM Rules/Calculators/A&P                      Mckenzie Rodriguez is a 86 y.o. female with a medical history of diabetes who presents to the ED for decreased appetite and not eating for the past three weeks.  She states not eating because food does not taste good.  She is a poor historian.  She saw her PCP 3 days ago for the same problem, her labs including TSH were normal.  She is type II diabetic, her sugars have been within normal range.  She denies any recent illness, fever, chest pain, shortness breath, abdominal pain, nausea, vomiting, diarrhea.  She denies any known cancer.  She has been losing weight with her decreased  appetite.  HPI and physical exam as above. Female who appears stated age, hard of hearing, mild acute distress, appears fatigued but is alert and nontoxic, hemodynamically stable, afebrile.  Additional history provided by daughter, she fell a week ago, rolled out of bed and fell on her face. Ct imaging demonstrates a small left subdural hemorrhage. We consulted neurosurgery, no acute intervention. Low suspicion for cva, covid, acs, mesenteric ischemia. She does have a mild anion gap, not explained by any findings on her abdominal ct. Plan to admit her to medicine for weight loss and rehydration with neurosurgery following.  I consulted medicine for admission, they have evaluated the patient and agree with plan for admission. I discussed this plan with the patient and family, they understand and agree with plan for admission.     Final Clinical Impression(s) / ED Diagnoses Final diagnoses:  Subdural hemorrhage [facility])    Rx / DC Orders ED Discharge Orders    None       Treana Lacour, Ladona Ridgel, MD 11/30/19 2339    Melene Plan, DO 12/03/19 [id number]

## 2019-11-30 NOTE — ED Notes (Signed)
Patient transported to X-ray 

## 2019-12-01 ENCOUNTER — Observation Stay (HOSPITAL_COMMUNITY): Payer: Medicare HMO

## 2019-12-01 ENCOUNTER — Other Ambulatory Visit: Payer: Self-pay

## 2019-12-01 DIAGNOSIS — S065X0A Traumatic subdural hemorrhage without loss of consciousness, initial encounter: Secondary | ICD-10-CM | POA: Diagnosis present

## 2019-12-01 DIAGNOSIS — E876 Hypokalemia: Secondary | ICD-10-CM | POA: Diagnosis not present

## 2019-12-01 DIAGNOSIS — K117 Disturbances of salivary secretion: Secondary | ICD-10-CM | POA: Diagnosis present

## 2019-12-01 DIAGNOSIS — E860 Dehydration: Secondary | ICD-10-CM | POA: Diagnosis present

## 2019-12-01 DIAGNOSIS — E11649 Type 2 diabetes mellitus with hypoglycemia without coma: Secondary | ICD-10-CM | POA: Diagnosis not present

## 2019-12-01 DIAGNOSIS — B370 Candidal stomatitis: Secondary | ICD-10-CM | POA: Diagnosis not present

## 2019-12-01 DIAGNOSIS — S065X9A Traumatic subdural hemorrhage with loss of consciousness of unspecified duration, initial encounter: Secondary | ICD-10-CM | POA: Diagnosis not present

## 2019-12-01 DIAGNOSIS — R630 Anorexia: Secondary | ICD-10-CM | POA: Diagnosis present

## 2019-12-01 DIAGNOSIS — Z7984 Long term (current) use of oral hypoglycemic drugs: Secondary | ICD-10-CM | POA: Diagnosis not present

## 2019-12-01 DIAGNOSIS — E441 Mild protein-calorie malnutrition: Secondary | ICD-10-CM | POA: Diagnosis present

## 2019-12-01 DIAGNOSIS — R627 Adult failure to thrive: Secondary | ICD-10-CM | POA: Diagnosis present

## 2019-12-01 DIAGNOSIS — Y92003 Bedroom of unspecified non-institutional (private) residence as the place of occurrence of the external cause: Secondary | ICD-10-CM | POA: Diagnosis not present

## 2019-12-01 DIAGNOSIS — N179 Acute kidney failure, unspecified: Secondary | ICD-10-CM | POA: Diagnosis present

## 2019-12-01 DIAGNOSIS — M6281 Muscle weakness (generalized): Secondary | ICD-10-CM | POA: Diagnosis present

## 2019-12-01 DIAGNOSIS — U071 COVID-19: Principal | ICD-10-CM

## 2019-12-01 DIAGNOSIS — E871 Hypo-osmolality and hyponatremia: Secondary | ICD-10-CM | POA: Diagnosis present

## 2019-12-01 DIAGNOSIS — Z79899 Other long term (current) drug therapy: Secondary | ICD-10-CM | POA: Diagnosis not present

## 2019-12-01 DIAGNOSIS — E785 Hyperlipidemia, unspecified: Secondary | ICD-10-CM | POA: Diagnosis present

## 2019-12-01 DIAGNOSIS — E872 Acidosis: Secondary | ICD-10-CM | POA: Diagnosis present

## 2019-12-01 DIAGNOSIS — D6959 Other secondary thrombocytopenia: Secondary | ICD-10-CM | POA: Diagnosis not present

## 2019-12-01 DIAGNOSIS — H9190 Unspecified hearing loss, unspecified ear: Secondary | ICD-10-CM | POA: Diagnosis present

## 2019-12-01 DIAGNOSIS — I10 Essential (primary) hypertension: Secondary | ICD-10-CM | POA: Diagnosis present

## 2019-12-01 DIAGNOSIS — W06XXXA Fall from bed, initial encounter: Secondary | ICD-10-CM | POA: Diagnosis present

## 2019-12-01 DIAGNOSIS — G4700 Insomnia, unspecified: Secondary | ICD-10-CM | POA: Diagnosis present

## 2019-12-01 DIAGNOSIS — D6489 Other specified anemias: Secondary | ICD-10-CM | POA: Diagnosis not present

## 2019-12-01 LAB — HEMOGLOBIN A1C
Hgb A1c MFr Bld: 6.5 % — ABNORMAL HIGH (ref 4.8–5.6)
Mean Plasma Glucose: 139.85 mg/dL

## 2019-12-01 LAB — CBG MONITORING, ED
Glucose-Capillary: 62 mg/dL — ABNORMAL LOW (ref 70–99)
Glucose-Capillary: 80 mg/dL (ref 70–99)
Glucose-Capillary: 57 mg/dL — ABNORMAL LOW (ref 70–99)
Glucose-Capillary: 155 mg/dL — ABNORMAL HIGH (ref 70–99)

## 2019-12-01 LAB — GLUCOSE, CAPILLARY: Glucose-Capillary: 129 mg/dL — ABNORMAL HIGH (ref 70–99)

## 2019-12-01 LAB — SARS CORONAVIRUS 2 (TAT 6-24 HRS): SARS Coronavirus 2: POSITIVE — AB

## 2019-12-01 LAB — C-REACTIVE PROTEIN: CRP: 1.5 mg/dL — ABNORMAL HIGH (ref <1.0)

## 2019-12-01 LAB — HIV ANTIBODY (ROUTINE TESTING W REFLEX): HIV Screen 4th Generation wRfx: NONREACTIVE

## 2019-12-01 LAB — TSH: TSH: 1.58 u[IU]/mL (ref 0.350–4.500)

## 2019-12-01 LAB — SODIUM, URINE, RANDOM: Sodium, Ur: 54 mmol/L

## 2019-12-01 LAB — OSMOLALITY, URINE: Osmolality, Ur: 552 mOsm/kg (ref 300–900)

## 2019-12-01 LAB — GLUCOSE, RANDOM: Glucose, Bld: 69 mg/dL — ABNORMAL LOW (ref 70–99)

## 2019-12-01 MED ORDER — DEXTROSE 50 % IV SOLN
INTRAVENOUS | Status: AC
  Filled 2019-12-01: qty 50

## 2019-12-01 MED ORDER — DEXTROSE-NACL 5-0.9 % IV SOLN: INTRAVENOUS | Status: DC

## 2019-12-01 MED ORDER — DEXTROSE 50 % IV SOLN
50.0000 mL | Freq: Once | INTRAVENOUS | Status: AC
  Administered 2019-12-01: 50 mL via INTRAVENOUS

## 2019-12-01 MED ORDER — DEXTROSE 50 % IV SOLN
INTRAVENOUS | Status: AC
  Administered 2019-12-01: 50 mL
  Filled 2019-12-01: qty 50

## 2019-12-01 NOTE — ED Notes (Signed)
Transferred call for patient to speak with her daughter.

## 2019-12-01 NOTE — ED Notes (Signed)
MD paged RE blood sugar and covid result.

## 2019-12-01 NOTE — Progress Notes (Signed)
  NEUROSURGERY PROGRESS NOTE   Repeat head CT stable. No new hemorrhage.  Patient also noted + covid.  No indication for NS intervention for this small SDH No need for repeat imaging unless exam changes. No outpatient follow up necessary Please call for any concerns

## 2019-12-01 NOTE — Progress Notes (Signed)
   12/01/19 2100  Vitals  Temp 99.6 F (37.6 C)  Temp Source Oral  BP (!) 126/59  MAP (mmHg) 77  BP Location Left Arm  BP Method Automatic  Patient Position (if appropriate) Lying  Pulse Rate (!) 104  Pulse Rate Source Dinamap  Oxygen Therapy  SpO2 96 %  O2 Device Room Air  Pain Assessment  Pain Scale 0-10  Pain Score 0  MEWS Score  MEWS RR 0  MEWS Pulse 1  MEWS Systolic 0  MEWS LOC 0  MEWS Temp 0  MEWS Score 1  MEWS Score Color Green   The patient is  Admitted to [address] with the diagnosis of covid 19. A & O x 4 with some forgetfulness. The patient is oriented to her room,ascom/call bell and staff. Patient voice no concern. Will continue to monitor.

## 2019-12-01 NOTE — Progress Notes (Signed)
[facility]                                                                              Patient Demographics  Mckenzie Rodriguez, is a 86 y.o. female, DOB - 1931/10/31, NWG:[id number]  Admit date - 11/30/2019   Admitting Physician Kathrynn Running, MD  Outpatient Primary MD for the patient is Patient, No Pcp Per  Outpatient specialists:   LOS - 0  days    Chief Complaint  Patient presents with  . Covid/illness       Brief summary  Mckenzie Rodriguez is a 86 y.o. female with medical history significant for Type 2 diabetes presenting with nearly 4 week history of generalized weakness, poor appetite with decreased oral intake associated poor taste and bitter  Days in her mouth.  Patient lives at home by herself.  Seven prescribed mirtazapine for "appetite and insomnia" by PCP recently.  She had accidental fall about a week prior to presentation. At the ED patient noted to have small subdural hematoma and Neurosurgery was consulted and admitted to hospitalist service.   Assessment & Plan    Principal Problem:   Subdural hematoma (HCC) Active Problems:   T2DM (type 2 diabetes mellitus) (HCC)   Decreased appetite   Unintentional weight loss   Hyponatremia   Covid-19 infection Covid test positive  Patient  Presents with generalized weakness, poor intake, bitter taste in mouth and weight loss due to COVID 19 infection. Daughter updated educated about COVID-19, and family is due for testing tomorrow or sometime this week.   Supportive care with IV fluids   Dehydration Due to poor intake related to COVID-19 infection IV fluids  Monitor renal function  with electrolyte   Acute kidney injury  Mild  related to COVID-19 infection with dehydration and poor intake  IV  Rehydration with monitor electrolytes   Hyponatremia Improving Related to cover 19 infection with dehydration  Subdural hematoma (HCC) CT noted small subdural hematoma without mass  effect Neurosurgery consulted- hold of routine seizure prophylaxis, recommend outpatient Neurosurgery  Clinic follow-up on discharge  Type 2 diabetes  mellitus Episode of hypoglycemia  Diabetic medication on hold Dextrose based IV fluid     Code Status:  Full code DVT Prophylaxis:  SCD's Family Communication: Discussed in detail with the patient, all imaging results, lab results explained to the patient  and daughter Sheralyn Boatman  Disposition Plan:  Home with family  Time Spent in minutes 35 minutes  Procedures:   not applicable  Consultants:    neurosurgery  Antimicrobials:      Medications  Scheduled Meds: . enalapril  10 mg Oral Daily  . gemfibrozil  600 mg Oral BID  . montelukast  10 mg Oral QHS  . pravastatin  80 mg Oral q1800   Continuous Infusions: . sodium chloride 100 mL/hr at 12/01/19 0822   PRN Meds:.   Antibiotics   Anti-infectives (From admission, onward)   None        Subjective:   Mckenzie Rodriguez was seen and examined today.  Patient denies dizziness, chest pain, shortness of breath, abdominal pain, but continues to feel weak poor  appetite.  Has had episodes of hypoglycemia -  IV fluid changed   Objective:   Vitals:   12/01/19 1200 12/01/19 1215 12/01/19 1230 12/01/19 1245  BP: (!) 124/53 124/62 107/69 134/76  Pulse: 98 94 95 (!) 48  Resp: (!) 28 (!) 29 (!) 26 (!) 21  Temp:      TempSrc:      SpO2:        Intake/Output Summary (Last 24 hours) at 12/01/2019 1306 Last data filed at 11/30/2019 2129 Gross per 24 hour  Intake 1000 ml  Output --  Net 1000 ml     Wt Readings from Last 3 Encounters:  No data found for Wt     Exam  General: NAD  HEENT: NCAT,  PERRL,Dry MMM  Neck: SUPPLE, (-) JVD  Cardiovascular: RRR, (-) GALLOP, (-) MURMUR  Respiratory: CTA  Gastrointestinal: SOFT, (-) DISTENSION, BS(+), (_) TENDERNESS  Ext: (-) CYANOSIS, (-) EDEMA  Neuro: A, OX 3  Skin:(-) RASH     Data Reviewed:  I have personally  reviewed following labs and imaging studies  Micro Results Recent Results (from the past 240 hour(s))  SARS CORONAVIRUS 2 (TAT 6-24 HRS) Nasopharyngeal Nasopharyngeal Swab     Status: Abnormal   Collection Time: 11/30/19  7:17 PM   Specimen: Nasopharyngeal Swab  Result Value Ref Range Status   SARS Coronavirus 2 POSITIVE (A) NEGATIVE Final    Comment: RESULT CALLED TO, READ BACK BY AND VERIFIED WITH: RN BRENDA NICKELSON AT 0729 BY MESSAN HOUEGNIFIO ON 12/01/2019 (NOTE) SARS-CoV-2 target nucleic acids are DETECTED. The SARS-CoV-2 RNA is generally detectable in upper and lower respiratory specimens during the acute phase of infection. Positive results are indicative of the presence of SARS-CoV-2 RNA. Clinical correlation with patient history and other diagnostic information is  necessary to determine patient infection status. Positive results do not rule out bacterial infection or co-infection with other viruses.  The expected result is Negative. Fact Sheet for Patients: SugarRoll.be Fact Sheet for Healthcare Providers: https://www.woods-mathews.com/ This test is not yet approved or cleared by the [place] FDA and  has been authorized for detection and/or diagnosis of SARS-CoV-2 by FDA under an Emergency Use Authorization (EUA). This EUA will remain  in effect (meaning this  test can be used) for the duration of the COVID-19 declaration under Section 564(b)(1) of the Act, 21 U.S.C. section 360bbb-3(b)(1), unless the authorization is terminated or revoked sooner. Performed at [facility], [place] [address] [place], [place] [address]     Radiology Reports Chest Xray 2 View  Result Date: 11/30/2019 CLINICAL DATA:  Unintentional weight loss EXAM: CHEST - 2 VIEW COMPARISON:  None. FINDINGS: Cardiac shadow is at the upper limits of normal in size. Aortic calcifications are noted. The lungs are clear. No effusion is noted. No bony  abnormality is seen. IMPRESSION: No acute abnormality noted. Aortic Atherosclerosis (ICD10-I70.0). Electronically Signed   By: Inez Catalina M.D.   On: 11/30/2019 23:13   CT HEAD WO CONTRAST  Result Date: 12/01/2019 CLINICAL DATA:  Subdural hemorrhage. EXAM: CT HEAD WITHOUT CONTRAST TECHNIQUE: Contiguous axial images were obtained from the base of the skull through the vertex without intravenous contrast. COMPARISON:  11/30/2019 FINDINGS: Brain: Small focal subdural hematoma identified in the left high frontal parietal region is stable in the interval no evidence for interval rebleeding. There is no evidence for hydrocephalus, mass lesion, or abnormal extra-axial fluid collection. No definite CT evidence for acute infarction. Patchy low attenuation in the deep  hemispheric and periventricular white matter is nonspecific, but likely reflects chronic microvascular ischemic demyelination. Diffuse loss of parenchymal volume is consistent with atrophy. Prominence of the lateral ventricles is stable and likely related to central atrophy. Vascular: No hyperdense vessel or unexpected calcification. Skull: Insert normal bone Sinuses/Orbits: The visualized paranasal sinuses and mastoid air cells are clear. Other: None. IMPRESSION: 1. Stable small left frontoparietal subdural hematoma. No evidence for interval rebleeding. 2. Atrophy with chronic small vessel white matter ischemic disease. Electronically Signed   By: Kennith Center M.D.   On: 12/01/2019 07:41   CT Head Wo Contrast  Result Date: 11/30/2019 CLINICAL DATA:  Ataxia. EXAM: CT HEAD WITHOUT CONTRAST TECHNIQUE: Contiguous axial images were obtained from the base of the skull through the vertex without intravenous contrast. COMPARISON:  None. FINDINGS: Brain: A small acute subdural bleed is seen along the left frontal lobe. This measures approximately 6 mm in maximum thickness (axial CT images 18 through 24, CT series number 2). There is very mild mass effect on the  adjacent sulci. No midline shift is seen. There is mild cerebral atrophy with widening of the extra-axial spaces and ventricular dilatation. There are areas of decreased attenuation within the white matter tracts of the supratentorial brain, consistent with microvascular disease changes. Vascular: No hyperdense vessel or unexpected calcification. Skull: Normal. Negative for fracture or focal lesion. Sinuses/Orbits: No acute finding. Other: None. IMPRESSION: 1. Small acute left frontal lobe subdural bleed. 2. 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 Electronically Signed   By: Aram Candela M.D.   On: 11/30/2019 18:18   CT ABDOMEN PELVIS W CONTRAST  Result Date: 11/30/2019 CLINICAL DATA:  Feeling sick for 3 weeks. EXAM: CT ABDOMEN AND PELVIS WITH CONTRAST TECHNIQUE: Multidetector CT imaging of the abdomen and pelvis was performed using the standard protocol following bolus administration of intravenous contrast. CONTRAST:  OMNIPAQUE IOHEXOL 350 MG/ML SOLN COMPARISON:  None. FINDINGS: Lower chest: Hazy left lower lobe airspace disease which may be secondary to an infectious or inflammatory etiology. Distal esophageal wall thickening as can be seen with esophagitis. Hepatobiliary: Diffuse low attenuation of the liver as can be seen with hepatic steatosis. Normal gallbladder. No hepatic mass. Pancreas: Unremarkable. No pancreatic ductal dilatation or surrounding inflammatory changes. Spleen: Normal in size without focal abnormality. Adrenals/Urinary Tract: Normal adrenal glands. 4 cm hypodense, fluid attenuating left renal mass. Bilateral nephrolithiasis. No ureterolithiasis. Normal bladder. Stomach/Bowel: Relative thickening of the wall of the pylorus likely reflecting peristalsis. No bowel dilatation or bowel wall thickening. No pneumatosis, pneumoperitoneum or portal venous gas. Diverticulosis without evidence of diverticulitis. Vascular/Lymphatic: Normal caliber  abdominal aorta with mild atherosclerosis. No lymphadenopathy. Reproductive: Uterus and bilateral adnexa are unremarkable. Other: No abdominal wall hernia or abnormality. No abdominopelvic ascites. Musculoskeletal: No acute osseous abnormality. No aggressive osseous lesion. Severe degenerative disease with disc height loss at L3-4 with reactive endplate sclerosis. Bilateral facet arthropathy of the lumbar spine. Chronic L3 vertebral body compression fracture. IMPRESSION: 1. No acute abdominal or pelvic pathology. 2. Hazy left lower lobe airspace disease which may be secondary to an infectious or inflammatory etiology. 3. Hepatic steatosis 4. Thickening of the distal esophageal wall as can be seen with esophagitis. Electronically Signed   By: Elige Ko   On: 11/30/2019 18:29    Lab Data:  CBC: Recent Labs  Lab 11/30/19 1242 11/30/19 1903  WBC 4.3  --   NEUTROABS 3.3  --   HGB 13.7 12.9  HCT 41.6 38.0  MCV 89.5  --   PLT 163  --    Basic Metabolic Panel: Recent Labs  Lab 11/30/19 1242 11/30/19 1903 12/01/19 0301  NA 133* 131*  --   K 5.1 5.0  --   CL 97*  --   --   CO2 20*  --   --   GLUCOSE 102*  --  69*  BUN 40*  --   --   CREATININE 1.07*  --   --   CALCIUM 9.2  --   --    GFR: CrCl cannot be calculated (Unknown ideal weight.). Liver Function Tests: Recent Labs  Lab 11/30/19 1242  AST 22  ALT 11  ALKPHOS 54  BILITOT 0.8  PROT 7.5  ALBUMIN 3.7   Recent Labs  Lab 11/30/19 1242  LIPASE 36   No results for input(s): AMMONIA in the last 168 hours. Coagulation Profile: No results for input(s): INR, PROTIME in the last 168 hours. Cardiac Enzymes: No results for input(s): CKTOTAL, CKMB, CKMBINDEX, TROPONINI in the last 168 hours. BNP (last 3 results) No results for input(s): PROBNP in the last 8760 hours. HbA1C: Recent Labs    12/01/19 0301  HGBA1C 6.5*   CBG: Recent Labs  Lab 11/30/19 1241 12/01/19 0725 12/01/19 1055  GLUCAP 100* 62* 80   Lipid  Profile: No results for input(s): CHOL, HDL, LDLCALC, TRIG, CHOLHDL, LDLDIRECT in the last 72 hours. Thyroid Function Tests: Recent Labs    12/01/19 0115  TSH 1.580   Anemia Panel: No results for input(s): VITAMINB12, FOLATE, FERRITIN, TIBC, IRON, RETICCTPCT in the last 72 hours. Urine analysis:    Component Value Date/Time   COLORURINE YELLOW 11/30/2019 1242   APPEARANCEUR HAZY (A) 11/30/2019 1242   LABSPEC 1.016 11/30/2019 1242   PHURINE 5.0 11/30/2019 1242   GLUCOSEU >=500 (A) 11/30/2019 1242   HGBUR NEGATIVE 11/30/2019 1242   BILIRUBINUR NEGATIVE 11/30/2019 1242   KETONESUR 5 (A) 11/30/2019 1242   PROTEINUR NEGATIVE 11/30/2019 1242   NITRITE NEGATIVE 11/30/2019 1242   LEUKOCYTESUR NEGATIVE 11/30/2019 1242     Jackie Plum M.D. [facility] 12/01/2019, 1:06 PM  Pager: [phone] Between 7am to 7pm - call Pager - [phone]  After 7pm go to [email or web address] - password [email or web address]  Call night coverage person covering after 7pm

## 2019-12-01 NOTE — ED Notes (Signed)
Dr. Julio Sicks and this RN are talking with daughter, Knute Neu, pt's Health Care POA via telephone regarding pt's plan of care.

## 2019-12-02 ENCOUNTER — Encounter (HOSPITAL_COMMUNITY): Payer: Self-pay | Admitting: Internal Medicine

## 2019-12-02 LAB — COMPREHENSIVE METABOLIC PANEL
ALT: 10 U/L (ref 0–44)
AST: 23 U/L (ref 15–41)
Albumin: 3.1 g/dL — ABNORMAL LOW (ref 3.5–5.0)
Alkaline Phosphatase: 47 U/L (ref 38–126)
Anion gap: 11 (ref 5–15)
BUN: 20 mg/dL (ref 8–23)
CO2: 18 mmol/L — ABNORMAL LOW (ref 22–32)
Calcium: 8.2 mg/dL — ABNORMAL LOW (ref 8.9–10.3)
Chloride: 105 mmol/L (ref 98–111)
Creatinine, Ser: 0.69 mg/dL (ref 0.44–1.00)
GFR calc Af Amer: >60 mL/min (ref >60)
GFR calc non Af Amer: >60 mL/min (ref >60)
Glucose, Bld: 118 mg/dL — ABNORMAL HIGH (ref 70–99)
Potassium: 4.2 mmol/L (ref 3.5–5.1)
Sodium: 134 mmol/L — ABNORMAL LOW (ref 135–145)
Total Bilirubin: 1 mg/dL (ref 0.3–1.2)
Total Protein: 6.7 g/dL (ref 6.5–8.1)

## 2019-12-02 LAB — PROCALCITONIN: Procalcitonin: <0.1 ng/mL

## 2019-12-02 LAB — CBC WITH DIFFERENTIAL/PLATELET
Abs Immature Granulocytes: 0.01 10*3/uL (ref 0.00–0.07)
Basophils Absolute: 0 10*3/uL (ref 0.0–0.1)
Basophils Relative: 0 %
Eosinophils Absolute: 0 10*3/uL (ref 0.0–0.5)
Eosinophils Relative: 0 %
HCT: 37.8 % (ref 36.0–46.0)
Hemoglobin: 12.7 g/dL (ref 12.0–15.0)
Immature Granulocytes: 0 %
Lymphocytes Relative: 18 %
Lymphs Abs: 0.5 10*3/uL — ABNORMAL LOW (ref 0.7–4.0)
MCH: 29.5 pg (ref 26.0–34.0)
MCHC: 33.6 g/dL (ref 30.0–36.0)
MCV: 87.7 fL (ref 80.0–100.0)
Monocytes Absolute: 0.5 10*3/uL (ref 0.1–1.0)
Monocytes Relative: 17 %
Neutro Abs: 2 10*3/uL (ref 1.7–7.7)
Neutrophils Relative %: 65 %
Platelets: 130 10*3/uL — ABNORMAL LOW (ref 150–400)
RBC: 4.31 MIL/uL (ref 3.87–5.11)
RDW: 12.6 % (ref 11.5–15.5)
WBC: 3 10*3/uL — ABNORMAL LOW (ref 4.0–10.5)
nRBC: 0 % (ref 0.0–0.2)

## 2019-12-02 LAB — GLUCOSE, CAPILLARY
Glucose-Capillary: 102 mg/dL — ABNORMAL HIGH (ref 70–99)
Glucose-Capillary: 89 mg/dL (ref 70–99)
Glucose-Capillary: 119 mg/dL — ABNORMAL HIGH (ref 70–99)
Glucose-Capillary: 102 mg/dL — ABNORMAL HIGH (ref 70–99)

## 2019-12-02 LAB — LACTATE DEHYDROGENASE: LDH: 172 U/L (ref 98–192)

## 2019-12-02 MED ORDER — SODIUM CHLORIDE 0.9 % IV SOLN
100.0000 mg | Freq: Every day | INTRAVENOUS | Status: AC
  Administered 2019-12-03 – 2019-12-06 (×4): 100 mg via INTRAVENOUS
  Filled 2019-12-02: qty 100
  Filled 2019-12-02: qty 20
  Filled 2019-12-02: qty 100
  Filled 2019-12-02: qty 20

## 2019-12-02 MED ORDER — SODIUM CHLORIDE 0.9 % IV SOLN
200.0000 mg | Freq: Once | INTRAVENOUS | Status: AC
  Administered 2019-12-02: 200 mg via INTRAVENOUS
  Filled 2019-12-02: qty 200

## 2019-12-02 MED ORDER — INSULIN ASPART 100 UNIT/ML ~~LOC~~ SOLN
0.0000 [IU] | Freq: Three times a day (TID) | SUBCUTANEOUS | Status: DC
  Administered 2019-12-05 – 2019-12-07 (×3): 1 [IU] via SUBCUTANEOUS
  Administered 2019-12-07: 2 [IU] via SUBCUTANEOUS
  Administered 2019-12-07: 1 [IU] via SUBCUTANEOUS
  Administered 2019-12-08: 2 [IU] via SUBCUTANEOUS
  Administered 2019-12-08: 1 [IU] via SUBCUTANEOUS

## 2019-12-02 NOTE — Progress Notes (Signed)
PROGRESS NOTE    Halina Asano Brisbon  VOH:[id number] DOB: 12/09/1930 DOA: 11/30/2019 PCP: Patient, No Pcp Per   Brief Narrative:  Mckenzie Rodriguez is a 86 y.o. female with medical history significant for t2dm presents with above.  Bulk of history obtained from patient's daughter. Patient lives alone. For at least 3 weeks she has complained of food not tasting good, tasting bitter. Such that she does not want to eat or drink much. Has lost around 10 pounds. No pain with swallowing or difficulty swallowing. No pain anywhere, no fevers or cough. No dysuria or hematuria. Thinks had a normal colonoscopy about 10 years ago. No blood in stool. Fell off bed about a week ago and bumped head. No lesion.  Saw new PCP a few days ago, prescribed mirtazapine for appetite and insomnia.   CT head found small subdural hematoma, neurosurg consulted and recommended outpatient follow up.   COVID positive with stable O2 sats but very weak, decreased appetite, hypoglycemia (had fall at home that lead to SDH).   Assessment & Plan:   Principal Problem:   COVID-19 Active Problems:   Subdural hematoma (HCC)   T2DM (type 2 diabetes mellitus) (HCC)   Decreased appetite   Unintentional weight loss   Hyponatremia  Covid-19 infection Covid test positive, patient symptomatic with mild dehydration, decreased appetite, hypoglycemia, weakness, leading to fall at home that lead to SDH.  Started on Remdesivir PCT nl, no Abx started.  Monitor COVID inflammatory markers Not on on O2 supplementation, dexamethasone not started   Dehydration Due to poor intake related to COVID-19 infection Continue IV fluids Monitor renal function  with electrolyte   Acute kidney injury  Mild, related to COVID-19 infection with dehydration and poor intake  Continue IV  Rehydration with monitor electrolytes   Hyponatremia Improving Related to cover 19 infection with dehydration  Subdural hematoma (HCC) CT noted small subdural  hematoma without mass effect Neurosurgery consulted- holding off on of routine seizure prophylaxis, recommended outpatient Neurosurgery  Clinic follow-up on discharge  Type 2 diabetes  mellitus Had episode of  hypoglycemia on 1/9, started on D5 infusion which will be continued due to her poor appetite and oral intake.  Home meds on hold Start CBG monitoring with SSI Dietitian consultation   Physical debility Patient weak, deconditioned, had fall at home leading to SDH.  Has been seen by PT with recommendation for [facility] PT    Code Status:  Full code DVT Prophylaxis:  SCD's Family Communication: Discussed with her daughter Vivien Rota and the son-in-law on the phone, they both agree with IP treatment and HH once patient is ready for dc.   Disposition Plan:  Home with family Consultants:   Neurosurgery   Procedures:   None   Antimicrobials:   None   Subjective: She continues to feel tired and weak. Her respiratory status is stable. No focal neuro deficits, denies headaches of vision changes. Continues to have bitter taste in her mouth with decreased appetite.   Objective: Vitals:   12/02/19 0315 12/02/19 0425 12/02/19 0529 12/02/19 0808  BP: (!) 138/126 (!) 110/59 (!) 110/59 104/62  Pulse:    97  Resp: 16  14 15   Temp: 98.3 F (36.8 C)  98.9 F (37.2 C) 98.5 F (36.9 C)  TempSrc: Oral  Oral Oral  SpO2: 98%  99% 97%    Intake/Output Summary (Last 24 hours) at 12/02/2019 1536 Last data filed at 12/02/2019 0915 Gross per 24 hour  Intake 2650.12 ml  Output  2025 ml  Net 625.12 ml   There were no vitals filed for this visit.  Examination:  General exam: Appears calm and comfortable  Respiratory system: Respiratory effort normal. No wheezing.  Cardiovascular system: S1 & S2 present, RRR.  Gastrointestinal system: Abdomen is nondistended, soft and nontender.  Central nervous system: Alert and oriented x2, no facial droop. Able to move ext x4 voluntairly. Extremities:  No edema Psychiatry: Mood & affect appropriate.     Data Reviewed: I have personally reviewed following labs and imaging studies  CBC: Recent Labs  Lab 11/30/19 1242 11/30/19 1903 12/02/19 0818  WBC 4.3  --  3.0*  NEUTROABS 3.3  --  2.0  HGB 13.7 12.9 12.7  HCT 41.6 38.0 37.8  MCV 89.5  --  87.7  PLT 163  --  130*   Basic Metabolic Panel: Recent Labs  Lab 11/30/19 1242 11/30/19 1903 12/01/19 0301 12/02/19 0818  NA 133* 131*  --  134*  K 5.1 5.0  --  4.2  CL 97*  --   --  105  CO2 20*  --   --  18*  GLUCOSE 102*  --  69* 118*  BUN 40*  --   --  20  CREATININE 1.07*  --   --  0.69  CALCIUM 9.2  --   --  8.2*   GFR: CrCl cannot be calculated (Unknown ideal weight.). Liver Function Tests: Recent Labs  Lab 11/30/19 1242 12/02/19 0818  AST 22 23  ALT 11 10  ALKPHOS 54 47  BILITOT 0.8 1.0  PROT 7.5 6.7  ALBUMIN 3.7 3.1*   Recent Labs  Lab 11/30/19 1242  LIPASE 36   No results for input(s): AMMONIA in the last 168 hours. Coagulation Profile: No results for input(s): INR, PROTIME in the last 168 hours. Cardiac Enzymes: No results for input(s): CKTOTAL, CKMB, CKMBINDEX, TROPONINI in the last 168 hours. BNP (last 3 results) No results for input(s): PROBNP in the last 8760 hours. HbA1C: Recent Labs    12/01/19 0301  HGBA1C 6.5*   CBG: Recent Labs  Lab 12/01/19 1913 12/01/19 2011 12/01/19 2042 12/02/19 0807 12/02/19 1255  GLUCAP 57* 155* 129* 102* 89   Lipid Profile: No results for input(s): CHOL, HDL, LDLCALC, TRIG, CHOLHDL, LDLDIRECT in the last 72 hours. Thyroid Function Tests: Recent Labs    12/01/19 0115  TSH 1.580   Anemia Panel: No results for input(s): VITAMINB12, FOLATE, FERRITIN, TIBC, IRON, RETICCTPCT in the last 72 hours. Sepsis Labs: Recent Labs  Lab 11/30/19 1900 12/02/19 0818  PROCALCITON  --  <0.10  LATICACIDVEN 1.3  --     Recent Results (from the past 240 hour(s))  SARS CORONAVIRUS 2 (TAT 6-24 HRS) Nasopharyngeal  Nasopharyngeal Swab     Status: Abnormal   Collection Time: 11/30/19  7:17 PM   Specimen: Nasopharyngeal Swab  Result Value Ref Range Status   SARS Coronavirus 2 POSITIVE (A) NEGATIVE Final    Comment: RESULT CALLED TO, READ BACK BY AND VERIFIED WITH: RN BRENDA NICKELSON AT 0729 BY MESSAN HOUEGNIFIO ON 12/01/2019 (NOTE) SARS-CoV-2 target nucleic acids are DETECTED. The SARS-CoV-2 RNA is generally detectable in upper and lower respiratory specimens during the acute phase of infection. Positive results are indicative of the presence of SARS-CoV-2 RNA. Clinical correlation with patient history and other diagnostic information is  necessary to determine patient infection status. Positive results do not rule out bacterial infection or co-infection with other viruses.  The expected result is Negative. Fact  Sheet for Patients: HairSlick.no Fact Sheet for Healthcare Providers: quierodirigir.com This test is not yet approved or cleared by the [place] FDA and  has been authorized for detection and/or diagnosis of SARS-CoV-2 by FDA under an Emergency Use Authorization (EUA). This EUA will remain  in effect (meaning this  test can be used) for the duration of the COVID-19 declaration under Section 564(b)(1) of the Act, 21 U.S.C. section 360bbb-3(b)(1), unless the authorization is terminated or revoked sooner. Performed at [facility], [address], [place], [place] [address]          Radiology Studies: Chest Xray 2 View  Result Date: 11/30/2019 CLINICAL DATA:  Unintentional weight loss EXAM: CHEST - 2 VIEW COMPARISON:  None. FINDINGS: Cardiac shadow is at the upper limits of normal in size. Aortic calcifications are noted. The lungs are clear. No effusion is noted. No bony abnormality is seen. IMPRESSION: No acute abnormality noted. Aortic Atherosclerosis (ICD10-I70.0). Electronically Signed   By: Alcide Clever M.D.   On:  11/30/2019 23:13   CT HEAD WO CONTRAST  Result Date: 12/01/2019 CLINICAL DATA:  Subdural hemorrhage. EXAM: CT HEAD WITHOUT CONTRAST TECHNIQUE: Contiguous axial images were obtained from the base of the skull through the vertex without intravenous contrast. COMPARISON:  11/30/2019 FINDINGS: Brain: Small focal subdural hematoma identified in the left high frontal parietal region is stable in the interval no evidence for interval rebleeding. There is no evidence for hydrocephalus, mass lesion, or abnormal extra-axial fluid collection. No definite CT evidence for acute infarction. Patchy low attenuation in the deep hemispheric and periventricular white matter is nonspecific, but likely reflects chronic microvascular ischemic demyelination. Diffuse loss of parenchymal volume is consistent with atrophy. Prominence of the lateral ventricles is stable and likely related to central atrophy. Vascular: No hyperdense vessel or unexpected calcification. Skull: Insert normal bone Sinuses/Orbits: The visualized paranasal sinuses and mastoid air cells are clear. Other: None. IMPRESSION: 1. Stable small left frontoparietal subdural hematoma. No evidence for interval rebleeding. 2. Atrophy with chronic small vessel white matter ischemic disease. Electronically Signed   By: Kennith Center M.D.   On: 12/01/2019 07:41   CT Head Wo Contrast  Result Date: 11/30/2019 CLINICAL DATA:  Ataxia. EXAM: CT HEAD WITHOUT CONTRAST TECHNIQUE: Contiguous axial images were obtained from the base of the skull through the vertex without intravenous contrast. COMPARISON:  None. FINDINGS: Brain: A small acute subdural bleed is seen along the left frontal lobe. This measures approximately 6 mm in maximum thickness (axial CT images 18 through 24, CT series number 2). There is very mild mass effect on the adjacent sulci. No midline shift is seen. There is mild cerebral atrophy with widening of the extra-axial spaces and ventricular dilatation. There are  areas of decreased attenuation within the white matter tracts of the supratentorial brain, consistent with microvascular disease changes. Vascular: No hyperdense vessel or unexpected calcification. Skull: Normal. Negative for fracture or focal lesion. Sinuses/Orbits: No acute finding. Other: None. IMPRESSION: 1. Small acute left frontal lobe subdural bleed. 2. 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 Electronically Signed   By: Aram Candela M.D.   On: 11/30/2019 18:18   CT ABDOMEN PELVIS W CONTRAST  Result Date: 11/30/2019 CLINICAL DATA:  Feeling sick for 3 weeks. EXAM: CT ABDOMEN AND PELVIS WITH CONTRAST TECHNIQUE: Multidetector CT imaging of the abdomen and pelvis was performed using the standard protocol following bolus administration of intravenous contrast. CONTRAST:  OMNIPAQUE IOHEXOL 350 MG/ML SOLN COMPARISON:  None. FINDINGS: Lower chest: Hazy left lower lobe airspace disease which may be secondary to an infectious or inflammatory etiology. Distal esophageal wall thickening as can be seen with esophagitis. Hepatobiliary: Diffuse low attenuation of the liver as can be seen with hepatic steatosis. Normal gallbladder. No hepatic mass. Pancreas: Unremarkable. No pancreatic ductal dilatation or surrounding inflammatory changes. Spleen: Normal in size without focal abnormality. Adrenals/Urinary Tract: Normal adrenal glands. 4 cm hypodense, fluid attenuating left renal mass. Bilateral nephrolithiasis. No ureterolithiasis. Normal bladder. Stomach/Bowel: Relative thickening of the wall of the pylorus likely reflecting peristalsis. No bowel dilatation or bowel wall thickening. No pneumatosis, pneumoperitoneum or portal venous gas. Diverticulosis without evidence of diverticulitis. Vascular/Lymphatic: Normal caliber abdominal aorta with mild atherosclerosis. No lymphadenopathy. Reproductive: Uterus and bilateral adnexa are unremarkable. Other: No abdominal wall  hernia or abnormality. No abdominopelvic ascites. Musculoskeletal: No acute osseous abnormality. No aggressive osseous lesion. Severe degenerative disease with disc height loss at L3-4 with reactive endplate sclerosis. Bilateral facet arthropathy of the lumbar spine. Chronic L3 vertebral body compression fracture. IMPRESSION: 1. No acute abdominal or pelvic pathology. 2. Hazy left lower lobe airspace disease which may be secondary to an infectious or inflammatory etiology. 3. Hepatic steatosis 4. Thickening of the distal esophageal wall as can be seen with esophagitis. Electronically Signed   By: Elige Ko   On: 11/30/2019 18:29        Scheduled Meds: . enalapril  10 mg Oral Daily  . gemfibrozil  600 mg Oral BID  . insulin aspart  0-9 Units Subcutaneous TID WC  . montelukast  10 mg Oral QHS  . pravastatin  80 mg Oral q1800   Continuous Infusions: . dextrose 5 % and 0.9% NaCl 100 mL/hr at 12/01/19 2016  . [START ON 12/03/2019] remdesivir 100 mg in NS 100 mL       LOS: 1 day    Time spent: 35 minutes     Ky Barban, MD [facility]   If 7PM-7AM, please contact night-coverage [email or web address] Password [facility] 12/02/2019, 3:36 PM

## 2019-12-02 NOTE — Evaluation (Signed)
Physical Therapy Evaluation Patient Details Name: Mckenzie Rodriguez MRN: [id number] DOB: 1931-06-12 Today's Date: 12/02/2019   History of Present Illness  Mckenzie Rodriguez is an 86yo female presenting with unintentional weight loss. Mckenzie Rodriguez COVID+, hasn't eaten in 3 weeks due to food not tasting good, tasting bitter. Lost 10pounds in the last 3 weeks. Fell off bed about a week ago, ct of head found smalls ubdural hematoma. PMH: DM2    Clinical Impression  Mckenzie Rodriguez admitted with above. Mckenzie Rodriguez indep and living alone PTA. Mckenzie Rodriguez now lethargic, weak, and very deconditioned. Mckenzie Rodriguez requiring assist for all transfers and ADLs, and remains unable to eat food due to it not tasting good. Mckenzie Rodriguez reports her daughter will stay with her however unsure logistics due to patient being COVID+. For Mckenzie Rodriguez to go home safely 24/7 ASSIST most be provided as Mckenzie Rodriguez is at increased falls risk and very weak/deconditioned. Acute Mckenzie Rodriguez con't to follow to progress indep with mobility.    Follow Up Recommendations Home health Mckenzie Rodriguez;Supervision/Assistance - 24 hour(SNF If 24/7 can't be provided)    Equipment Recommendations  Rolling walker with 5" wheels    Recommendations for Other Services       Precautions / Restrictions Precautions Precautions: Fall Precaution Comments: FTT Restrictions Weight Bearing Restrictions: No      Mobility  Bed Mobility Overal bed mobility: Needs Assistance Bed Mobility: Supine to Sit     Supine to sit: Min assist     General bed mobility comments: minA for trunk elevation with HOB elevated, labored effort for patient, Mckenzie Rodriguez able to bring LEs off EOB  Transfers Overall transfer level: Needs assistance Equipment used: 1 person hand held assist Transfers: Sit to/from [facility] Sit to Stand: Min assist Stand pivot transfers: Mod assist       General transfer comment: minA to power up, increased time, HHA on L provided for std pvt, Mckenzie Rodriguez pushing alot into PTs hand, slow and guarded, c/o "I"m so weak and  tired"  Ambulation/Gait             General Gait Details: limited to std pvt to chair as Mckenzie Rodriguez lethargic, weak, and sleepy  Stairs            Wheelchair Mobility    Modified Rankin (Stroke Patients Only)       Balance Overall balance assessment: Needs assistance Sitting-balance support: Feet supported;No upper extremity supported Sitting balance-Leahy Scale: Fair     Standing balance support: Bilateral upper extremity supported;During functional activity Standing balance-Leahy Scale: Poor Standing balance comment: dependent on physical assist                             Pertinent Vitals/Pain Pain Assessment: Faces Faces Pain Scale: Hurts even more Pain Location: neck and shoulders Pain Descriptors / Indicators: Discomfort;Sore Pain Intervention(s): Monitored during session    Home Living Family/patient expects to be discharged to:: Private residence Living Arrangements: Alone Available Help at Discharge: Family;Available PRN/intermittently Type of Home: Apartment Home Access: Level entry     Home Layout: One level   Additional Comments: Mckenzie Rodriguez sleepy and unable to hold conversation to obtain rest of home set up    Prior Function Level of Independence: Independent         Comments: Mckenzie Rodriguez was indep  up until a week ago, Mckenzie Rodriguez states her daughter is taking care of her     Hand Dominance   Dominant Hand: Right    Extremity/Trunk Assessment  Upper Extremity Assessment Upper Extremity Assessment: Generalized weakness    Lower Extremity Assessment Lower Extremity Assessment: Generalized weakness    Cervical / Trunk Assessment Cervical / Trunk Assessment: Kyphotic  Communication   Communication: No difficulties  Cognition Arousal/Alertness: Lethargic Behavior During Therapy: Flat affect Overall Cognitive Status: Within Functional Limits for tasks assessed                                 General Comments: Mckenzie Rodriguez slow to respond  due to lethargy but able to follow commands, Mckenzie Rodriguez oriented x 4      General Comments General comments (skin integrity, edema, etc.): VSS    Exercises     Assessment/Plan    Mckenzie Rodriguez Assessment Patient needs continued Mckenzie Rodriguez services  Mckenzie Rodriguez Problem List Decreased strength;Decreased activity tolerance;Decreased balance;Decreased mobility;Decreased coordination;Decreased knowledge of use of DME       Mckenzie Rodriguez Treatment Interventions DME instruction;Gait training;Stair training;Functional mobility training;Therapeutic activities;Therapeutic exercise    Mckenzie Rodriguez Goals (Current goals can be found in the Care Plan section)  Acute Rehab Mckenzie Rodriguez Goals Patient Stated Goal: home Mckenzie Rodriguez Goal Formulation: With patient Time For Goal Achievement: 12/16/19 Potential to Achieve Goals: Good    Frequency Min 3X/week   Barriers to discharge Decreased caregiver support lives alone    Co-evaluation               AM-PAC Mckenzie Rodriguez "6 Clicks" Mobility  Outcome Measure Help needed turning from your back to your side while in a flat bed without using bedrails?: A Little Help needed moving from lying on your back to sitting on the side of a flat bed without using bedrails?: A Little Help needed moving to and from a bed to a chair (including a wheelchair)?: A Little Help needed standing up from a chair using your arms (e.g., wheelchair or bedside chair)?: A Little Help needed to walk in hospital room?: A Lot Help needed climbing 3-5 steps with a railing? : A Lot 6 Click Score: 16    End of Session   Activity Tolerance: Patient limited by fatigue;Patient limited by lethargy Patient left: in chair;with call bell/phone within reach;with nursing/sitter in room Nurse Communication: Mobility status(fatigue) Mckenzie Rodriguez Visit Diagnosis: Muscle weakness (generalized) (M62.81);Difficulty in walking, not elsewhere classified (R26.2)    Time: 1340-1404 Mckenzie Rodriguez Time Calculation (min) (ACUTE ONLY): 24 min   Charges:   Mckenzie Rodriguez Evaluation $Mckenzie Rodriguez Eval Moderate  Complexity: 1 Mod Mckenzie Rodriguez Treatments $Therapeutic Activity: 8-22 mins        Mckenzie Rodriguez, Mckenzie Rodriguez, Mckenzie Rodriguez Acute Rehabilitation Services Pager #: [phone] Office #: [phone]   Iona Hansen 12/02/2019, 2:33 PM

## 2019-12-02 NOTE — Progress Notes (Addendum)
[facility]                                                                              Patient Demographics  Mckenzie Rodriguez, is a 86 y.o. female, DOB - 05/17/1931, QVZ:[id number]  Admit date - 11/30/2019   Admitting Physician Jackie Plum, MD  Outpatient Primary MD for the patient is Patient, No Pcp Per  Outpatient specialists:   LOS - 1  days    Chief Complaint  Patient presents with  . Covid/illness       Brief summary  Mckenzie Rodriguez is a 86 y.o. female with medical history significant for Type 2 diabetes presenting with nearly 4 week history of generalized weakness, poor appetite with decreased oral intake associated poor taste and bitter  Days in her mouth.  Patient lives at home by herself.  Seven prescribed mirtazapine for "appetite and insomnia" by PCP recently.  She had accidental fall about a week prior to presentation. At the ED patient noted to have small subdural hematoma and Neurosurgery was consulted and admitted to hospitalist service.   Assessment & Plan    Principal Problem:   COVID-19 Active Problems:   Subdural hematoma (HCC)   T2DM (type 2 diabetes mellitus) (HCC)   Decreased appetite   Unintentional weight loss   Hyponatremia   Covid-19 infection Covid test positive  Patient  Presents with generalized weakness, poor intake, bitter taste in mouth and weight loss due to COVID 19 infection Remdesivir initiated. Patient remains weak Physical therapy note reviewed  Supportive care with IV fluids   Dehydration Due to poor intake related to COVID-19 infection IV fluids  Monitor renal function  with electrolyte   Acute kidney injury  Mild  related to COVID-19 infection with dehydration and poor intake  IV  Rehydration with monitor electrolytes   Hyponatremia Improving Related to cover 19 infection with dehydration  Subdural hematoma (HCC) CT noted small subdural hematoma without mass effect Neurosurgery consulted- hold  of routine seizure prophylaxis, recommend outpatient Neurosurgery  Clinic follow-up on discharge  Type 2 diabetes  mellitus Episode of hypoglycemia  Diabetic medication on hold Dextrose based IV fluid     Code Status:  Full code DVT Prophylaxis:  SCD's Family Communication:  Left voicemail for daughter Disposition Plan:  Home with family  Time Spent in minutes 25 minutes  Procedures:   not applicable  Consultants:    neurosurgery  Antimicrobials:      Medications  Scheduled Meds: . enalapril  10 mg Oral Daily  . gemfibrozil  600 mg Oral BID  . insulin aspart  0-9 Units Subcutaneous TID WC  . montelukast  10 mg Oral QHS  . pravastatin  80 mg Oral q1800   Continuous Infusions: . dextrose 5 % and 0.9% NaCl 100 mL/hr at 12/01/19 2016  . [START ON 12/03/2019] remdesivir 100 mg in NS 100 mL     PRN Meds:.   Antibiotics   Anti-infectives (From admission, onward)   Start     Dose/Rate Route Frequency Ordered Stop   12/03/19 1000  remdesivir 100 mg in sodium chloride 0.9 % 100 mL IVPB  100 mg 200 mL/hr over 30 Minutes Intravenous Daily 12/02/19 0754 12/07/19 0959   12/02/19 0830  remdesivir 200 mg in sodium chloride 0.9% 250 mL IVPB     200 mg 580 mL/hr over 30 Minutes Intravenous Once 12/02/19 0754 12/02/19 0945        Subjective:   Mckenzie Rodriguez was seen -no acute events reported overnight.  Remains weak  Objective:   Vitals:   12/02/19 0315 12/02/19 0425 12/02/19 0529 12/02/19 0808  BP: (!) 138/126 (!) 110/59 (!) 110/59 104/62  Pulse:    97  Resp: 16  14 15   Temp: 98.3 F (36.8 C)  98.9 F (37.2 C) 98.5 F (36.9 C)  TempSrc: Oral  Oral Oral  SpO2: 98%  99% 97%    Intake/Output Summary (Last 24 hours) at 12/02/2019 1742 Last data filed at 12/02/2019 0915 Gross per 24 hour  Intake 1650.12 ml  Output 2025 ml  Net -374.88 ml     Wt Readings from Last 3 Encounters:  No data found for Wt     Exam  General: NAD  HEENT: NCAT,  PERRL,Dry  MMM  Neck: SUPPLE, (-) JVD  Cardiovascular: RRR, (-) GALLOP, (-) MURMUR  Respiratory: CTA  Gastrointestinal: SOFT, (-) DISTENSION, BS(+), (_) TENDERNESS  Ext: (-) CYANOSIS, (-) EDEMA  Neuro: A, OX 3  Skin:(-) RASH     Data Reviewed:  I have personally reviewed following labs and imaging studies  Micro Results Recent Results (from the past 240 hour(s))  SARS CORONAVIRUS 2 (TAT 6-24 HRS) Nasopharyngeal Nasopharyngeal Swab     Status: Abnormal   Collection Time: 11/30/19  7:17 PM   Specimen: Nasopharyngeal Swab  Result Value Ref Range Status   SARS Coronavirus 2 POSITIVE (A) NEGATIVE Final    Comment: RESULT CALLED TO, READ BACK BY AND VERIFIED WITH: RN BRENDA NICKELSON AT 0729 BY MESSAN HOUEGNIFIO ON 12/01/2019 (NOTE) SARS-CoV-2 target nucleic acids are DETECTED. The SARS-CoV-2 RNA is generally detectable in upper and lower respiratory specimens during the acute phase of infection. Positive results are indicative of the presence of SARS-CoV-2 RNA. Clinical correlation with patient history and other diagnostic information is  necessary to determine patient infection status. Positive results do not rule out bacterial infection or co-infection with other viruses.  The expected result is Negative. Fact Sheet for Patients: 01/29/2020 Fact Sheet for Healthcare Providers: HairSlick.no This test is not yet approved or cleared by the [place] FDA and  has been authorized for detection and/or diagnosis of SARS-CoV-2 by FDA under an Emergency Use Authorization (EUA). This EUA will remain  in effect (meaning this  test can be used) for the duration of the COVID-19 declaration under Section 564(b)(1) of the Act, 21 U.S.C. section 360bbb-3(b)(1), unless the authorization is terminated or revoked sooner. Performed at [facility], [address], [place], [place]     Radiology Reports Chest Xray 2  View  Result Date: 11/30/2019 CLINICAL DATA:  Unintentional weight loss EXAM: CHEST - 2 VIEW COMPARISON:  None. FINDINGS: Cardiac shadow is at the upper limits of normal in size. Aortic calcifications are noted. The lungs are clear. No effusion is noted. No bony abnormality is seen. IMPRESSION: No acute abnormality noted. Aortic Atherosclerosis (ICD10-I70.0). Electronically Signed   By: 01/28/2020 M.D.   On: 11/30/2019 23:13   CT HEAD WO CONTRAST  Result Date: 12/01/2019 CLINICAL DATA:  Subdural hemorrhage. EXAM: CT HEAD WITHOUT CONTRAST TECHNIQUE: Contiguous axial images were obtained from the base of  the skull through the vertex without intravenous contrast. COMPARISON:  11/30/2019 FINDINGS: Brain: Small focal subdural hematoma identified in the left high frontal parietal region is stable in the interval no evidence for interval rebleeding. There is no evidence for hydrocephalus, mass lesion, or abnormal extra-axial fluid collection. No definite CT evidence for acute infarction. Patchy low attenuation in the deep hemispheric and periventricular white matter is nonspecific, but likely reflects chronic microvascular ischemic demyelination. Diffuse loss of parenchymal volume is consistent with atrophy. Prominence of the lateral ventricles is stable and likely related to central atrophy. Vascular: No hyperdense vessel or unexpected calcification. Skull: Insert normal bone Sinuses/Orbits: The visualized paranasal sinuses and mastoid air cells are clear. Other: None. IMPRESSION: 1. Stable small left frontoparietal subdural hematoma. No evidence for interval rebleeding. 2. Atrophy with chronic small vessel white matter ischemic disease. Electronically Signed   By: Kennith Center M.D.   On: 12/01/2019 07:41   CT Head Wo Contrast  Result Date: 11/30/2019 CLINICAL DATA:  Ataxia. EXAM: CT HEAD WITHOUT CONTRAST TECHNIQUE: Contiguous axial images were obtained from the base of the skull through the vertex without  intravenous contrast. COMPARISON:  None. FINDINGS: Brain: A small acute subdural bleed is seen along the left frontal lobe. This measures approximately 6 mm in maximum thickness (axial CT images 18 through 24, CT series number 2). There is very mild mass effect on the adjacent sulci. No midline shift is seen. There is mild cerebral atrophy with widening of the extra-axial spaces and ventricular dilatation. There are areas of decreased attenuation within the white matter tracts of the supratentorial brain, consistent with microvascular disease changes. Vascular: No hyperdense vessel or unexpected calcification. Skull: Normal. Negative for fracture or focal lesion. Sinuses/Orbits: No acute finding. Other: None. IMPRESSION: 1. Small acute left frontal lobe subdural bleed. 2. 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 Electronically Signed   By: Aram Candela M.D.   On: 11/30/2019 18:18   CT ABDOMEN PELVIS W CONTRAST  Result Date: 11/30/2019 CLINICAL DATA:  Feeling sick for 3 weeks. EXAM: CT ABDOMEN AND PELVIS WITH CONTRAST TECHNIQUE: Multidetector CT imaging of the abdomen and pelvis was performed using the standard protocol following bolus administration of intravenous contrast. CONTRAST:  OMNIPAQUE IOHEXOL 350 MG/ML SOLN COMPARISON:  None. FINDINGS: Lower chest: Hazy left lower lobe airspace disease which may be secondary to an infectious or inflammatory etiology. Distal esophageal wall thickening as can be seen with esophagitis. Hepatobiliary: Diffuse low attenuation of the liver as can be seen with hepatic steatosis. Normal gallbladder. No hepatic mass. Pancreas: Unremarkable. No pancreatic ductal dilatation or surrounding inflammatory changes. Spleen: Normal in size without focal abnormality. Adrenals/Urinary Tract: Normal adrenal glands. 4 cm hypodense, fluid attenuating left renal mass. Bilateral nephrolithiasis. No ureterolithiasis. Normal bladder.  Stomach/Bowel: Relative thickening of the wall of the pylorus likely reflecting peristalsis. No bowel dilatation or bowel wall thickening. No pneumatosis, pneumoperitoneum or portal venous gas. Diverticulosis without evidence of diverticulitis. Vascular/Lymphatic: Normal caliber abdominal aorta with mild atherosclerosis. No lymphadenopathy. Reproductive: Uterus and bilateral adnexa are unremarkable. Other: No abdominal wall hernia or abnormality. No abdominopelvic ascites. Musculoskeletal: No acute osseous abnormality. No aggressive osseous lesion. Severe degenerative disease with disc height loss at L3-4 with reactive endplate sclerosis. Bilateral facet arthropathy of the lumbar spine. Chronic L3 vertebral body compression fracture. IMPRESSION: 1. No acute abdominal or pelvic pathology. 2. Hazy left lower lobe airspace disease which may be secondary to an infectious or inflammatory etiology. 3. Hepatic steatosis 4.  Thickening of the distal esophageal wall as can be seen with esophagitis. Electronically Signed   By: Kathreen Devoid   On: 11/30/2019 18:29    Lab Data:  CBC: Recent Labs  Lab 11/30/19 1242 11/30/19 1903 12/02/19 0818  WBC 4.3  --  3.0*  NEUTROABS 3.3  --  2.0  HGB 13.7 12.9 12.7  HCT 41.6 38.0 37.8  MCV 89.5  --  87.7  PLT 163  --  607*   Basic Metabolic Panel: Recent Labs  Lab 11/30/19 1242 11/30/19 1903 12/01/19 0301 12/02/19 0818  NA 133* 131*  --  134*  K 5.1 5.0  --  4.2  CL 97*  --   --  105  CO2 20*  --   --  18*  GLUCOSE 102*  --  69* 118*  BUN 40*  --   --  20  CREATININE 1.07*  --   --  0.69  CALCIUM 9.2  --   --  8.2*   GFR: CrCl cannot be calculated (Unknown ideal weight.). Liver Function Tests: Recent Labs  Lab 11/30/19 1242 12/02/19 0818  AST 22 23  ALT 11 10  ALKPHOS 54 47  BILITOT 0.8 1.0  PROT 7.5 6.7  ALBUMIN 3.7 3.1*   Recent Labs  Lab 11/30/19 1242  LIPASE 36   No results for input(s): AMMONIA in the last 168 hours. Coagulation  Profile: No results for input(s): INR, PROTIME in the last 168 hours. Cardiac Enzymes: No results for input(s): CKTOTAL, CKMB, CKMBINDEX, TROPONINI in the last 168 hours. BNP (last 3 results) No results for input(s): PROBNP in the last 8760 hours. HbA1C: Recent Labs    12/01/19 0301  HGBA1C 6.5*   CBG: Recent Labs  Lab 12/01/19 1913 12/01/19 2011 12/01/19 2042 12/02/19 0807 12/02/19 1255  GLUCAP 57* 155* 129* 102* 89   Lipid Profile: No results for input(s): CHOL, HDL, LDLCALC, TRIG, CHOLHDL, LDLDIRECT in the last 72 hours. Thyroid Function Tests: Recent Labs    12/01/19 0115  TSH 1.580   Anemia Panel: No results for input(s): VITAMINB12, FOLATE, FERRITIN, TIBC, IRON, RETICCTPCT in the last 72 hours. Urine analysis:    Component Value Date/Time   COLORURINE YELLOW 11/30/2019 1242   APPEARANCEUR HAZY (A) 11/30/2019 1242   LABSPEC 1.016 11/30/2019 1242   PHURINE 5.0 11/30/2019 1242   GLUCOSEU >=500 (A) 11/30/2019 1242   HGBUR NEGATIVE 11/30/2019 1242   BILIRUBINUR NEGATIVE 11/30/2019 1242   KETONESUR 5 (A) 11/30/2019 1242   PROTEINUR NEGATIVE 11/30/2019 1242   NITRITE NEGATIVE 11/30/2019 1242   LEUKOCYTESUR NEGATIVE 11/30/2019 1242     Benito Mccreedy M.D. [facility] 12/02/2019, 5:42 PM  Pager: [phone] Between 7am to 7pm - call Pager - [phone]  After 7pm go to [email or web address] - password [email or web address]  Call night coverage person covering after 7pm

## 2019-12-03 LAB — CBC WITH DIFFERENTIAL/PLATELET
Abs Immature Granulocytes: 0.01 10*3/uL (ref 0.00–0.07)
Basophils Absolute: 0 10*3/uL (ref 0.0–0.1)
Basophils Relative: 0 %
Eosinophils Absolute: 0 10*3/uL (ref 0.0–0.5)
Eosinophils Relative: 0 %
HCT: 37.3 % (ref 36.0–46.0)
Hemoglobin: 12.3 g/dL (ref 12.0–15.0)
Immature Granulocytes: 0 %
Lymphocytes Relative: 25 %
Lymphs Abs: 0.6 10*3/uL — ABNORMAL LOW (ref 0.7–4.0)
MCH: 28.7 pg (ref 26.0–34.0)
MCHC: 33 g/dL (ref 30.0–36.0)
MCV: 87.1 fL (ref 80.0–100.0)
Monocytes Absolute: 0.6 10*3/uL (ref 0.1–1.0)
Monocytes Relative: 23 %
Neutro Abs: 1.3 10*3/uL — ABNORMAL LOW (ref 1.7–7.7)
Neutrophils Relative %: 52 %
Platelets: 148 10*3/uL — ABNORMAL LOW (ref 150–400)
RBC: 4.28 MIL/uL (ref 3.87–5.11)
RDW: 12.6 % (ref 11.5–15.5)
WBC: 2.5 10*3/uL — ABNORMAL LOW (ref 4.0–10.5)
nRBC: 0 % (ref 0.0–0.2)

## 2019-12-03 LAB — COMPREHENSIVE METABOLIC PANEL
ALT: 11 U/L (ref 0–44)
AST: 23 U/L (ref 15–41)
Albumin: 2.8 g/dL — ABNORMAL LOW (ref 3.5–5.0)
Alkaline Phosphatase: 44 U/L (ref 38–126)
Anion gap: 7 (ref 5–15)
BUN: 13 mg/dL (ref 8–23)
CO2: 20 mmol/L — ABNORMAL LOW (ref 22–32)
Calcium: 8.1 mg/dL — ABNORMAL LOW (ref 8.9–10.3)
Chloride: 109 mmol/L (ref 98–111)
Creatinine, Ser: 0.56 mg/dL (ref 0.44–1.00)
GFR calc Af Amer: >60 mL/min (ref >60)
GFR calc non Af Amer: >60 mL/min (ref >60)
Glucose, Bld: 106 mg/dL — ABNORMAL HIGH (ref 70–99)
Potassium: 3.7 mmol/L (ref 3.5–5.1)
Sodium: 136 mmol/L (ref 135–145)
Total Bilirubin: 0.8 mg/dL (ref 0.3–1.2)
Total Protein: 6.2 g/dL — ABNORMAL LOW (ref 6.5–8.1)

## 2019-12-03 LAB — HCV INTERPRETATION

## 2019-12-03 LAB — HCV AB W REFLEX TO QUANT PCR: HCV Ab: <0.1 s/co ratio (ref 0.0–0.9)

## 2019-12-03 LAB — C-REACTIVE PROTEIN: CRP: 2.7 mg/dL — ABNORMAL HIGH (ref <1.0)

## 2019-12-03 LAB — GLUCOSE, CAPILLARY
Glucose-Capillary: 90 mg/dL (ref 70–99)
Glucose-Capillary: 85 mg/dL (ref 70–99)
Glucose-Capillary: 111 mg/dL — ABNORMAL HIGH (ref 70–99)
Glucose-Capillary: 97 mg/dL (ref 70–99)

## 2019-12-03 LAB — D-DIMER, QUANTITATIVE: D-Dimer, Quant: 1.13 ug/mL-FEU — ABNORMAL HIGH (ref 0.00–0.50)

## 2019-12-03 MED ORDER — ADULT MULTIVITAMIN W/MINERALS CH
1.0000 | ORAL_TABLET | Freq: Every day | ORAL | Status: DC
  Administered 2019-12-03 – 2019-12-08 (×6): 1 via ORAL
  Filled 2019-12-03 (×5): qty 1

## 2019-12-03 MED ORDER — GLUCERNA SHAKE PO LIQD
237.0000 mL | Freq: Three times a day (TID) | ORAL | Status: DC
  Administered 2019-12-03 – 2019-12-07 (×9): 237 mL via ORAL

## 2019-12-03 MED ORDER — MIRTAZAPINE 15 MG PO TABS
15.0000 mg | ORAL_TABLET | Freq: Every day | ORAL | Status: DC
  Administered 2019-12-03 – 2019-12-07 (×5): 15 mg via ORAL
  Filled 2019-12-03 (×5): qty 1

## 2019-12-03 NOTE — Progress Notes (Signed)
Initial Nutrition Assessment  INTERVENTION:   -Glucerna Shake po TID, each supplement provides 220 kcal and 10 grams of protein -Multivitamin with minerals daily  NUTRITION DIAGNOSIS:   Increased nutrient needs related to acute illness(COVID-19 infection) as evidenced by estimated needs.  GOAL:   Patient will meet greater than or equal to 90% of their needs  MONITOR:   PO intake, Supplement acceptance, Labs, Weight trends, I & O's  REASON FOR ASSESSMENT:   Consult Assessment of nutrition requirement/status  ASSESSMENT:   86 y.o. female with medical history significant for Type 2 diabetes presenting with nearly 4 week history of generalized weakness, poor appetite with decreased oral intake associated poor taste and bitter  Days in her mouth.  Patient lives at home by herself.  Seven prescribed mirtazapine for "appetite and insomnia" by PCP recently.  She had accidental fall about a week prior to presentation.  **RD working remotely**  Patient reports poor appetite and intakes related to loss of taste x 3-4 weeks now. Pt is positive for COVID-19. Over the past few weeks, pt has mainly been consuming a cake and coffee for breakfast and 1 Glucerna shake daily. No PO yet this admission. Pt was started on Remeron PTA.  RD will order Glucerna shakes and daily MVI for additional kcals and protein.  UBW is ~116 lbs. Pt has lost 10 lbs since symptoms began, this is a 8% wt loss x 3-4 weeks which is significant for time frame.   I/Os: +3.2L since admit UOP: 600 ml x 24 hrs  Medications reviewed. Labs reviewed: CBGs: 90-102  NUTRITION - FOCUSED PHYSICAL EXAM:  Working remotely.  Diet Order:   Diet Order            Diet heart healthy/carb modified Room service appropriate? Yes; Fluid consistency: Thin  Diet effective now              EDUCATION NEEDS:   No education needs have been identified at this time  Skin:  Skin Assessment: Reviewed RN Assessment  Last BM:   1/11 -type 6  Height:   Ht Readings from Last 1 Encounters:  No data found for Ht  Height: 4'10" per records in care everywhere  Weight:   Wt Readings from Last 1 Encounters:  12/03/19 48.2 kg    Ideal Body Weight:  44 kg(per height recorded in care everywhere)  BMI:  22.2 kg/m^2  Estimated Nutritional Needs:   Kcal:  1500-1700  Protein:  65-80g  Fluid:  1.7L/day  Tilda Franco, MS, RD, LDN Inpatient Clinical Dietitian Pager: [phone] After Hours Pager: [phone]

## 2019-12-03 NOTE — Progress Notes (Addendum)
PROGRESS NOTE    Mckenzie Rodriguez  FXT:[id number] DOB: 1931/05/21 DOA: 11/30/2019 PCP: Patient, No Pcp Per   Brief Narrative:  Mckenzie Rodriguez is a 86 y.o. female with medical history significant for t2dm presents with above.  Bulk of history obtained from patient's daughter. Patient lives alone. For at least 3 weeks she has complained of food not tasting good, tasting bitter. Such that she does not want to eat or drink much. Has lost around 10 pounds. No pain with swallowing or difficulty swallowing. No pain anywhere, no fevers or cough. No dysuria or hematuria. Thinks had a normal colonoscopy about 10 years ago. No blood in stool. Fell off bed about a week ago and bumped head. No lesion.  Saw new PCP a few days ago, prescribed mirtazapine for appetite and insomnia.   CT head found small subdural hematoma, neurosurg consulted and recommended outpatient follow up.   COVID positive with stable O2 sats but very weak, decreased appetite, hypoglycemia (had fall at home that lead to SDH).   Assessment & Plan:   Principal Problem:   COVID-19 Active Problems:   Subdural hematoma (HCC)   T2DM (type 2 diabetes mellitus) (HCC)   Decreased appetite   Unintentional weight loss   Hyponatremia  Covid-19 infection Covid test positive, patient symptomatic with mild dehydration, decreased appetite, hypoglycemia, weakness, leading to fall at home that lead to SDH.  Started on Remdesivir PCT nl, no Abx started.  Monitor COVID inflammatory markers Not on on O2 supplementation, dexamethasone not started as it is not indicated   Dehydration Due to poor intake related to COVID-19 infection Continue IV fluids Monitor renal function  with electrolyte   Acute kidney injury  Mild, related to COVID-19 infection with dehydration and poor intake  Continue IV  fluids    Hyponatremia Improving Related to cover 19 infection with dehydration  Subdural hematoma (HCC) CT noted small subdural  hematoma without mass effect Neurosurgery consulted- holding off on of routine seizure prophylaxis, recommended outpatient Neurosurgery  Clinic follow-up on discharge  Type 2 diabetes  mellitus Had episode of  hypoglycemia on 1/9, started on D5 infusion which will be continued due to her poor appetite and oral intake.  Home meds on hold Start CBG monitoring with [facility] Dietitian consultation, patient started on supplements  Physical debility Patient weak, deconditioned, had fall at home leading to SDH.  Has been seen by PT with recommendation for [facility] PT  Mild protein calorie malnutrition:  Patient with 10lb loss with 8% wt loss in 4 weeks due to acute illness, loss of appetite.  -Start glucerna TID, MV with mineral daily -Will restart mirtazapine hs as appetite stimulant.   Code Status:  Full code DVT Prophylaxis:  SCD's Family Communication: Discussed with her daughter Sheralyn Boatman and the son-in-law on the phone on 1/10, they both agree with IP treatment and HH once patient is ready for dc.   Disposition Plan:  Home with family Consultants:   Neurosurgery   Procedures:   None   Antimicrobials:   None   Subjective: She has been sipping on glucerna but tells me that it is too sweet. She continues to have a bitter taste in her mouth. She feels weak. Denies diarrhea. Has respiratory status is table.   Objective: Vitals:   12/02/19 2200 12/03/19 0523 12/03/19 0900 12/03/19 1650  BP: 115/60 132/78 111/69 127/79  Pulse: 100 95 92 93  Resp: 20 20 16 20   Temp: 98.2 F (36.8 C) 98.2 F (36.8  C) 98.9 F (37.2 C) 98 F (36.7 C)  TempSrc:  Oral Axillary Oral  SpO2: 98% 97% 98% 100%  Weight:  48.2 kg      Intake/Output Summary (Last 24 hours) at 12/03/2019 1926 Last data filed at 12/03/2019 1200 Gross per 24 hour  Intake 2100 ml  Output 400 ml  Net 1700 ml   Filed Weights   12/03/19 0523  Weight: 48.2 kg    Examination:  General exam: Appears calm and comfortable    Respiratory system: Respiratory effort normal. No wheezing.  Cardiovascular system: S1 & S2 present, RRR.  Gastrointestinal system: Abdomen is nondistended, soft and nontender.  Central nervous system: Alert and oriented x2, no facial droop. Able to move ext x4 voluntairly. Extremities: No edema Psychiatry: Mood & affect appropriate.     Data Reviewed: I have personally reviewed following labs and imaging studies  CBC: Recent Labs  Lab 11/30/19 1242 11/30/19 1903 12/02/19 0818 12/03/19 0431  WBC 4.3  --  3.0* 2.5*  NEUTROABS 3.3  --  2.0 1.3*  HGB 13.7 12.9 12.7 12.3  HCT 41.6 38.0 37.8 37.3  MCV 89.5  --  87.7 87.1  PLT 163  --  130* 148*   Basic Metabolic Panel: Recent Labs  Lab 11/30/19 1242 11/30/19 1903 12/01/19 0301 12/02/19 0818 12/03/19 0431  NA 133* 131*  --  134* 136  K 5.1 5.0  --  4.2 3.7  CL 97*  --   --  105 109  CO2 20*  --   --  18* 20*  GLUCOSE 102*  --  69* 118* 106*  BUN 40*  --   --  20 13  CREATININE 1.07*  --   --  0.69 0.56  CALCIUM 9.2  --   --  8.2* 8.1*   GFR: CrCl cannot be calculated (Unknown ideal weight.). Liver Function Tests: Recent Labs  Lab 11/30/19 1242 12/02/19 0818 12/03/19 0431  AST 22 23 23   ALT 11 10 11   ALKPHOS 54 47 44  BILITOT 0.8 1.0 0.8  PROT 7.5 6.7 6.2*  ALBUMIN 3.7 3.1* 2.8*   Recent Labs  Lab 11/30/19 1242  LIPASE 36   No results for input(s): AMMONIA in the last 168 hours. Coagulation Profile: No results for input(s): INR, PROTIME in the last 168 hours. Cardiac Enzymes: No results for input(s): CKTOTAL, CKMB, CKMBINDEX, TROPONINI in the last 168 hours. BNP (last 3 results) No results for input(s): PROBNP in the last 8760 hours. HbA1C: Recent Labs    12/01/19 0301  HGBA1C 6.5*   CBG: Recent Labs  Lab 12/02/19 1821 12/02/19 2157 12/03/19 0902 12/03/19 1200 12/03/19 1635  GLUCAP 119* 102* 90 85 111*   Lipid Profile: No results for input(s): CHOL, HDL, LDLCALC, TRIG, CHOLHDL,  LDLDIRECT in the last 72 hours. Thyroid Function Tests: Recent Labs    12/01/19 0115  TSH 1.580   Anemia Panel: No results for input(s): VITAMINB12, FOLATE, FERRITIN, TIBC, IRON, RETICCTPCT in the last 72 hours. Sepsis Labs: Recent Labs  Lab 11/30/19 1900 12/02/19 0818  PROCALCITON  --  <0.10  LATICACIDVEN 1.3  --     Recent Results (from the past 240 hour(s))  SARS CORONAVIRUS 2 (TAT 6-24 HRS) Nasopharyngeal Nasopharyngeal Swab     Status: Abnormal   Collection Time: 11/30/19  7:17 PM   Specimen: Nasopharyngeal Swab  Result Value Ref Range Status   SARS Coronavirus 2 POSITIVE (A) NEGATIVE Final    Comment: RESULT CALLED TO, READ BACK  BY AND VERIFIED WITH: RN BRENDA NICKELSON AT 3539 BY [place] ON 12/01/2019 (NOTE) SARS-CoV-2 target nucleic acids are DETECTED. The SARS-CoV-2 RNA is generally detectable in upper and lower respiratory specimens during the acute phase of infection. Positive results are indicative of the presence of SARS-CoV-2 RNA. Clinical correlation with patient history and other diagnostic information is  necessary to determine patient infection status. Positive results do not rule out bacterial infection or co-infection with other viruses.  The expected result is Negative. Fact Sheet for Patients: SugarRoll.be Fact Sheet for Healthcare Providers: https://www.woods-mathews.com/ This test is not yet approved or cleared by the [place] FDA and  has been authorized for detection and/or diagnosis of SARS-CoV-2 by FDA under an Emergency Use Authorization (EUA). This EUA will remain  in effect (meaning this  test can be used) for the duration of the COVID-19 declaration under Section 564(b)(1) of the Act, 21 U.S.C. section 360bbb-3(b)(1), unless the authorization is terminated or revoked sooner. Performed at [facility], [place] [address] [place], [place] [address]          Radiology  Studies: No results found.      Scheduled Meds: . enalapril  10 mg Oral Daily  . feeding supplement (GLUCERNA SHAKE)  237 mL Oral TID BM  . gemfibrozil  600 mg Oral BID  . insulin aspart  0-9 Units Subcutaneous TID WC  . montelukast  10 mg Oral QHS  . multivitamin with minerals  1 tablet Oral Daily  . pravastatin  80 mg Oral q1800   Continuous Infusions: . dextrose 5 % and 0.9% NaCl 100 mL/hr at 12/02/19 2130  . remdesivir 100 mg in NS 100 mL 100 mg (12/03/19 0906)     LOS: 2 days    Time spent: 35 minutes     Blain Pais, MD [facility]   If 7PM-7AM, please contact night-coverage [email or web address] Password [facility] 12/03/2019, 7:26 PM

## 2019-12-03 NOTE — Progress Notes (Signed)
Spoke with pt's family, Sheralyn Boatman, and discussed pt's poor po intake. Daughter had no questions and is appreciative of the staff for caring for her mother.

## 2019-12-04 LAB — CBC WITH DIFFERENTIAL/PLATELET
Abs Immature Granulocytes: 0.01 10*3/uL (ref 0.00–0.07)
Basophils Absolute: 0 10*3/uL (ref 0.0–0.1)
Basophils Relative: 0 %
Eosinophils Absolute: 0 10*3/uL (ref 0.0–0.5)
Eosinophils Relative: 1 %
HCT: 34.8 % — ABNORMAL LOW (ref 36.0–46.0)
Hemoglobin: 11.8 g/dL — ABNORMAL LOW (ref 12.0–15.0)
Immature Granulocytes: 0 %
Lymphocytes Relative: 29 %
Lymphs Abs: 0.8 10*3/uL (ref 0.7–4.0)
MCH: 29.4 pg (ref 26.0–34.0)
MCHC: 33.9 g/dL (ref 30.0–36.0)
MCV: 86.8 fL (ref 80.0–100.0)
Monocytes Absolute: 0.4 10*3/uL (ref 0.1–1.0)
Monocytes Relative: 14 %
Neutro Abs: 1.6 10*3/uL — ABNORMAL LOW (ref 1.7–7.7)
Neutrophils Relative %: 56 %
Platelets: 162 10*3/uL (ref 150–400)
RBC: 4.01 MIL/uL (ref 3.87–5.11)
RDW: 12.8 % (ref 11.5–15.5)
WBC: 2.8 10*3/uL — ABNORMAL LOW (ref 4.0–10.5)
nRBC: 0 % (ref 0.0–0.2)

## 2019-12-04 LAB — COMPREHENSIVE METABOLIC PANEL
ALT: 11 U/L (ref 0–44)
AST: 22 U/L (ref 15–41)
Albumin: 2.6 g/dL — ABNORMAL LOW (ref 3.5–5.0)
Alkaline Phosphatase: 42 U/L (ref 38–126)
Anion gap: 8 (ref 5–15)
BUN: 11 mg/dL (ref 8–23)
CO2: 18 mmol/L — ABNORMAL LOW (ref 22–32)
Calcium: 7.9 mg/dL — ABNORMAL LOW (ref 8.9–10.3)
Chloride: 110 mmol/L (ref 98–111)
Creatinine, Ser: 0.41 mg/dL — ABNORMAL LOW (ref 0.44–1.00)
GFR calc Af Amer: >60 mL/min (ref >60)
GFR calc non Af Amer: >60 mL/min (ref >60)
Glucose, Bld: 115 mg/dL — ABNORMAL HIGH (ref 70–99)
Potassium: 3.5 mmol/L (ref 3.5–5.1)
Sodium: 136 mmol/L (ref 135–145)
Total Bilirubin: 0.8 mg/dL (ref 0.3–1.2)
Total Protein: 6.1 g/dL — ABNORMAL LOW (ref 6.5–8.1)

## 2019-12-04 LAB — GLUCOSE, CAPILLARY
Glucose-Capillary: 102 mg/dL — ABNORMAL HIGH (ref 70–99)
Glucose-Capillary: 101 mg/dL — ABNORMAL HIGH (ref 70–99)
Glucose-Capillary: 119 mg/dL — ABNORMAL HIGH (ref 70–99)
Glucose-Capillary: 107 mg/dL — ABNORMAL HIGH (ref 70–99)

## 2019-12-04 LAB — D-DIMER, QUANTITATIVE: D-Dimer, Quant: 0.9 ug/mL-FEU — ABNORMAL HIGH (ref 0.00–0.50)

## 2019-12-04 LAB — C-REACTIVE PROTEIN: CRP: 2 mg/dL — ABNORMAL HIGH (ref <1.0)

## 2019-12-04 MED ORDER — ONDANSETRON HCL 4 MG PO TABS
4.0000 mg | ORAL_TABLET | Freq: Three times a day (TID) | ORAL | Status: DC | PRN
  Administered 2019-12-04: 4 mg via ORAL
  Filled 2019-12-04 (×2): qty 1

## 2019-12-04 MED ORDER — MAGIC MOUTHWASH
15.0000 mL | Freq: Three times a day (TID) | ORAL | Status: DC | PRN
  Filled 2019-12-04: qty 15

## 2019-12-04 NOTE — Progress Notes (Signed)
Daily Progress Note  Patient tolerated up to chair very well. Was alert and oriented x 4 today. Patient is unmotivated to eat and states she is nauseous when she tries to eat something. Patient was able to tolerate ambulation with one assist today very well.

## 2019-12-04 NOTE — Progress Notes (Signed)
Physical Therapy Treatment Patient Details Name: Mckenzie Rodriguez MRN: [id number] DOB: [birth year] Today's Date: 12/04/2019    History of Present Illness Pt is an 86yo female presenting with unintentional weight loss. Pt COVID+, hasn't eaten in 3 weeks due to food not tasting good, tasting bitter. Lost 10pounds in the last 3 weeks. Fell off bed about a week ago, ct of head found smalls ubdural hematoma. PMH: DM2    PT Comments    Pt very lethargic with minimal interaction/verbalization during session. She required mod assist bed mobility, mod assist sit to stand and mod assist SPT bed to recliner. Eyes closed. Immediately asleep once positioned in recliner. HR 85 SpO2 98% on RA.    Follow Up Recommendations  Home health PT;Supervision/Assistance - 24 hour(SNF if 24/7 cannot be provided)     Equipment Recommendations  Rolling walker with 5" wheels    Recommendations for Other Services       Precautions / Restrictions Precautions Precautions: Fall Precaution Comments: FTT Restrictions Weight Bearing Restrictions: No    Mobility  Bed Mobility Overal bed mobility: Needs Assistance Bed Mobility: Supine to Sit     Supine to sit: Mod assist;HOB elevated     General bed mobility comments: cues for sequencing, decreased initiation. Assist with BLE and to elevate trunk  Transfers Overall transfer level: Needs assistance   Transfers: Sit to/from Stand;Stand Pivot Transfers Sit to Stand: Mod assist Stand pivot transfers: Mod assist       General transfer comment: mod assist to power up from bed. Small pivot steps toward the left for bed to recliner transfer. Slow and guarded  Ambulation/Gait             General Gait Details: unable due to weakness and lethargy   Stairs             Wheelchair Mobility    Modified Rankin (Stroke Patients Only)       Balance Overall balance assessment: Needs assistance Sitting-balance support: Feet supported;No upper  extremity supported Sitting balance-Leahy Scale: Fair     Standing balance support: Bilateral upper extremity supported;During functional activity Standing balance-Leahy Scale: Poor Standing balance comment: reliant on external assist                            Cognition Arousal/Alertness: Lethargic Behavior During Therapy: Flat affect Overall Cognitive Status: Difficult to assess                                 General Comments: slow to respond due to lethargy      Exercises      General Comments General comments (skin integrity, edema, etc.): HR 85 SpO2 98% on RA      Pertinent Vitals/Pain Pain Assessment: Faces Faces Pain Scale: Hurts little more Pain Location: generalized with mobility Pain Descriptors / Indicators: Grimacing;Discomfort Pain Intervention(s): Repositioned;Monitored during session    Home Living                      Prior Function            PT Goals (current goals can now be found in the care plan section) Acute Rehab PT Goals Patient Stated Goal: not stated Progress towards PT goals: Not progressing toward goals - comment(lethargy)    Frequency    Min 3X/week      PT Plan Current plan  remains appropriate    Co-evaluation              AM-PAC PT "6 Clicks" Mobility   Outcome Measure  Help needed turning from your back to your side while in a flat bed without using bedrails?: A Little Help needed moving from lying on your back to sitting on the side of a flat bed without using bedrails?: A Lot Help needed moving to and from a bed to a chair (including a wheelchair)?: A Little Help needed standing up from a chair using your arms (e.g., wheelchair or bedside chair)?: A Little Help needed to walk in hospital room?: A Lot Help needed climbing 3-5 steps with a railing? : A Lot 6 Click Score: 15    End of Session   Activity Tolerance: Patient limited by lethargy;Patient limited by fatigue Patient  left: in chair;with call bell/phone within reach Nurse Communication: Mobility status PT Visit Diagnosis: Muscle weakness (generalized) (M62.81);Difficulty in walking, not elsewhere classified (R26.2)     Time: 1000-1015 PT Time Calculation (min) (ACUTE ONLY): 15 min  Charges:  $Therapeutic Activity: 8-22 mins                     Aida Raider, PT  Office # [phone] Pager [phone]    Ilda Foil 12/04/2019, 11:49 AM

## 2019-12-04 NOTE — Progress Notes (Signed)
PROGRESS NOTE    Mckenzie Rodriguez  IHK:[id number] DOB: 08-19-1931 DOA: 11/30/2019 PCP: Patient, No Pcp Per   Brief Narrative:  Mckenzie Rodriguez is a 86 y.o. female with medical history significant for t2dm presents with above.  Bulk of history obtained from patient's daughter. Patient lives alone. For at least 3 weeks she has complained of food not tasting good, tasting bitter. Such that she does not want to eat or drink much. Has lost around 10 pounds. No pain with swallowing or difficulty swallowing. No pain anywhere, no fevers or cough. No dysuria or hematuria. Thinks had a normal colonoscopy about 10 years ago. No blood in stool. Fell off bed about a week ago and bumped head. No lesion.  Saw new PCP a few days ago, prescribed mirtazapine for appetite and insomnia.   CT head found small subdural hematoma, neurosurg consulted and recommended outpatient follow up.   COVID positive with stable O2 sats but very weak, decreased appetite, hypoglycemia (had fall at home that lead to SDH).   Assessment & Plan:   Principal Problem:   COVID-19 Active Problems:   Subdural hematoma (HCC)   T2DM (type 2 diabetes mellitus) (HCC)   Decreased appetite   Unintentional weight loss   Hyponatremia  Covid-19 infection Covid test positive, patient symptomatic with mild dehydration, decreased appetite, hypoglycemia, weakness, leading to fall at home that lead to SDH.  Started on Remdesivir PCT nl, no Abx started.  Monitor COVID inflammatory markers Not on on O2 supplementation, dexamethasone not started as it is not indicated   Dehydration Due to poor intake related to COVID-19 infection Continue IV fluids Monitor renal function  with electrolyte Encourage oral hydration  Nausea Start Zofran po as needed No emesis reported Tolerating a little more po intake today  PossibleThrush She has some dry mouth with "bad" taste, decreased appetite. Not able to tolerate much po intake. Question  possible thrush with white tongue on exam but no plaques. Will start a trial or magic mouth wash.    Acute kidney injury  Mild, related to COVID-19 infection with dehydration and poor intake  Continue IV  fluids due to poor oral intake per above   Hyponatremia Improving Related to cover 19 infection with dehydration  Subdural hematoma (HCC) CT noted small subdural hematoma without mass effect Neurosurgery consulted- holding off on of routine seizure prophylaxis, recommended outpatient Neurosurgery  Clinic follow-up on discharge  Type 2 diabetes  mellitus Had episode of  hypoglycemia on 1/9, started on D5 infusion which will be continued due to her poor appetite and oral intake.  Home meds on hold Start CBG monitoring with SSI Dietitian consultation, patient started on supplements  Physical debility Patient weak, deconditioned, had fall at home leading to SDH.  Has been seen by PT with recommendation for [facility] PT  Mild protein calorie malnutrition:  Patient with 10lb loss with 8% wt loss in 4 weeks due to acute illness, loss of appetite.  -Continue glucerna TID, MV with mineral daily -Continue mirtazapine hs as appetite stimulant.  -Encourage small bites, liberalize diet, d/w RN  Code Status:  Full code DVT Prophylaxis:  SCD's Family Communication: Discussed with her daughter Sheralyn Boatman and the son-in-law on the phone on 1/10, they both agree with IP treatment and HH once patient is ready for dc.   Disposition Plan:  Home with family Consultants:   Neurosurgery   Procedures:   None   Antimicrobials:   None   Subjective: She reports some nausea  earlier today but no vomiting. Has been taking some sips of glucerna. Denies abdominal pain.   Objective: Vitals:   12/03/19 2053 12/04/19 0835 12/04/19 1340 12/04/19 1706  BP: 126/66 118/75 118/63 (!) 136/50  Pulse: 93 92 85 83  Resp: (!) 24  (!) 22 20  Temp: (!) 97.3 F (36.3 C) 98.5 F (36.9 C) (!) 97.4 F (36.3  C) 98.5 F (36.9 C)  TempSrc: Oral Oral Oral Oral  SpO2: 94% 97% 98% 96%  Weight:        Intake/Output Summary (Last 24 hours) at 12/04/2019 1716 Last data filed at 12/04/2019 1005 Gross per 24 hour  Intake 1529.08 ml  Output 650 ml  Net 879.08 ml   Filed Weights   12/03/19 0523  Weight: 48.2 kg    Examination:  General exam: Appears calm and comfortable  Respiratory system: Respiratory effort normal. No wheezing.  Cardiovascular system: S1 & S2 present, RRR.  Gastrointestinal system: Abdomen is nondistended, soft and nontender.  Central nervous system: Alert and oriented x2, no facial droop. Able to move ext x4 voluntairly. Extremities: No edema Psychiatry: Mood & affect appropriate.     Data Reviewed: I have personally reviewed following labs and imaging studies  CBC: Recent Labs  Lab 11/30/19 1242 11/30/19 1903 12/02/19 0818 12/03/19 0431 12/04/19 0435  WBC 4.3  --  3.0* 2.5* 2.8*  NEUTROABS 3.3  --  2.0 1.3* 1.6*  HGB 13.7 12.9 12.7 12.3 11.8*  HCT 41.6 38.0 37.8 37.3 34.8*  MCV 89.5  --  87.7 87.1 86.8  PLT 163  --  130* 148* 185   Basic Metabolic Panel: Recent Labs  Lab 11/30/19 1242 11/30/19 1903 12/01/19 0301 12/02/19 0818 12/03/19 0431 12/04/19 0435  NA 133* 131*  --  134* 136 136  K 5.1 5.0  --  4.2 3.7 3.5  CL 97*  --   --  105 109 110  CO2 20*  --   --  18* 20* 18*  GLUCOSE 102*  --  69* 118* 106* 115*  BUN 40*  --   --  20 13 11   CREATININE 1.07*  --   --  0.69 0.56 0.41*  CALCIUM 9.2  --   --  8.2* 8.1* 7.9*   GFR: CrCl cannot be calculated (Unknown ideal weight.). Liver Function Tests: Recent Labs  Lab 11/30/19 1242 12/02/19 0818 12/03/19 0431 12/04/19 0435  AST 22 23 23 22   ALT 11 10 11 11   ALKPHOS 54 47 44 42  BILITOT 0.8 1.0 0.8 0.8  PROT 7.5 6.7 6.2* 6.1*  ALBUMIN 3.7 3.1* 2.8* 2.6*   Recent Labs  Lab 11/30/19 1242  LIPASE 36   No results for input(s): AMMONIA in the last 168 hours. Coagulation Profile: No  results for input(s): INR, PROTIME in the last 168 hours. Cardiac Enzymes: No results for input(s): CKTOTAL, CKMB, CKMBINDEX, TROPONINI in the last 168 hours. BNP (last 3 results) No results for input(s): PROBNP in the last 8760 hours. HbA1C: No results for input(s): HGBA1C in the last 72 hours. CBG: Recent Labs  Lab 12/03/19 1635 12/03/19 2137 12/04/19 0837 12/04/19 1211 12/04/19 1700  GLUCAP 111* 97 102* 101* 119*   Lipid Profile: No results for input(s): CHOL, HDL, LDLCALC, TRIG, CHOLHDL, LDLDIRECT in the last 72 hours. Thyroid Function Tests: No results for input(s): TSH, T4TOTAL, FREET4, T3FREE, THYROIDAB in the last 72 hours. Anemia Panel: No results for input(s): VITAMINB12, FOLATE, FERRITIN, TIBC, IRON, RETICCTPCT in the last 72 hours.  Sepsis Labs: Recent Labs  Lab 11/30/19 1900 12/02/19 0818  PROCALCITON  --  <0.10  LATICACIDVEN 1.3  --     Recent Results (from the past 240 hour(s))  SARS CORONAVIRUS 2 (TAT 6-24 HRS) Nasopharyngeal Nasopharyngeal Swab     Status: Abnormal   Collection Time: 11/30/19  7:17 PM   Specimen: Nasopharyngeal Swab  Result Value Ref Range Status   SARS Coronavirus 2 POSITIVE (A) NEGATIVE Final    Comment: RESULT CALLED TO, READ BACK BY AND VERIFIED WITH: RN BRENDA NICKELSON AT 0729 BY MESSAN HOUEGNIFIO ON 12/01/2019 (NOTE) SARS-CoV-2 target nucleic acids are DETECTED. The SARS-CoV-2 RNA is generally detectable in upper and lower respiratory specimens during the acute phase of infection. Positive results are indicative of the presence of SARS-CoV-2 RNA. Clinical correlation with patient history and other diagnostic information is  necessary to determine patient infection status. Positive results do not rule out bacterial infection or co-infection with other viruses.  The expected result is Negative. Fact Sheet for Patients: HairSlick.no Fact Sheet for Healthcare  Providers: quierodirigir.com This test is not yet approved or cleared by the [place] FDA and  has been authorized for detection and/or diagnosis of SARS-CoV-2 by FDA under an Emergency Use Authorization (EUA). This EUA will remain  in effect (meaning this  test can be used) for the duration of the COVID-19 declaration under Section 564(b)(1) of the Act, 21 U.S.C. section 360bbb-3(b)(1), unless the authorization is terminated or revoked sooner. Performed at [facility], [address], [place], [place] [address]          Radiology Studies: No results found.      Scheduled Meds: . enalapril  10 mg Oral Daily  . feeding supplement (GLUCERNA SHAKE)  237 mL Oral TID BM  . gemfibrozil  600 mg Oral BID  . insulin aspart  0-9 Units Subcutaneous TID WC  . mirtazapine  15 mg Oral QHS  . montelukast  10 mg Oral QHS  . multivitamin with minerals  1 tablet Oral Daily  . pravastatin  80 mg Oral q1800   Continuous Infusions: . dextrose 5 % and 0.9% NaCl 100 mL/hr at 12/04/19 0702  . remdesivir 100 mg in NS 100 mL Stopped (12/04/19 1005)     LOS: 3 days    Time spent: 35 minutes     Ky Barban, MD [facility]   If 7PM-7AM, please contact night-coverage [email or web address] Password [facility] 12/04/2019, 5:16 PM

## 2019-12-05 DIAGNOSIS — R630 Anorexia: Secondary | ICD-10-CM

## 2019-12-05 LAB — CBC WITH DIFFERENTIAL/PLATELET
Abs Immature Granulocytes: 0 10*3/uL (ref 0.00–0.07)
Basophils Absolute: 0 10*3/uL (ref 0.0–0.1)
Basophils Relative: 0 %
Eosinophils Absolute: 0 10*3/uL (ref 0.0–0.5)
Eosinophils Relative: 1 %
HCT: 35.1 % — ABNORMAL LOW (ref 36.0–46.0)
Hemoglobin: 12.1 g/dL (ref 12.0–15.0)
Lymphocytes Relative: 14 %
Lymphs Abs: 0.4 10*3/uL — ABNORMAL LOW (ref 0.7–4.0)
MCH: 29.7 pg (ref 26.0–34.0)
MCHC: 34.5 g/dL (ref 30.0–36.0)
MCV: 86.2 fL (ref 80.0–100.0)
Monocytes Absolute: 0.4 10*3/uL (ref 0.1–1.0)
Monocytes Relative: 13 %
Neutro Abs: 2.3 10*3/uL (ref 1.7–7.7)
Neutrophils Relative %: 72 %
Platelets: 163 10*3/uL (ref 150–400)
RBC: 4.07 MIL/uL (ref 3.87–5.11)
RDW: 12.8 % (ref 11.5–15.5)
WBC: 3.2 10*3/uL — ABNORMAL LOW (ref 4.0–10.5)
nRBC: 0 % (ref 0.0–0.2)
nRBC: 0 /100 WBC

## 2019-12-05 LAB — GLUCOSE, CAPILLARY
Glucose-Capillary: 102 mg/dL — ABNORMAL HIGH (ref 70–99)
Glucose-Capillary: 124 mg/dL — ABNORMAL HIGH (ref 70–99)
Glucose-Capillary: 122 mg/dL — ABNORMAL HIGH (ref 70–99)
Glucose-Capillary: 97 mg/dL (ref 70–99)

## 2019-12-05 LAB — COMPREHENSIVE METABOLIC PANEL
ALT: 12 U/L (ref 0–44)
AST: 22 U/L (ref 15–41)
Albumin: 2.6 g/dL — ABNORMAL LOW (ref 3.5–5.0)
Alkaline Phosphatase: 49 U/L (ref 38–126)
Anion gap: 7 (ref 5–15)
BUN: 11 mg/dL (ref 8–23)
CO2: 21 mmol/L — ABNORMAL LOW (ref 22–32)
Calcium: 8 mg/dL — ABNORMAL LOW (ref 8.9–10.3)
Chloride: 111 mmol/L (ref 98–111)
Creatinine, Ser: 0.51 mg/dL (ref 0.44–1.00)
GFR calc Af Amer: >60 mL/min (ref >60)
GFR calc non Af Amer: >60 mL/min (ref >60)
Glucose, Bld: 106 mg/dL — ABNORMAL HIGH (ref 70–99)
Potassium: 3.5 mmol/L (ref 3.5–5.1)
Sodium: 139 mmol/L (ref 135–145)
Total Bilirubin: 0.8 mg/dL (ref 0.3–1.2)
Total Protein: 5.8 g/dL — ABNORMAL LOW (ref 6.5–8.1)

## 2019-12-05 LAB — D-DIMER, QUANTITATIVE: D-Dimer, Quant: 3.82 ug/mL-FEU — ABNORMAL HIGH (ref 0.00–0.50)

## 2019-12-05 LAB — C-REACTIVE PROTEIN: CRP: 1.7 mg/dL — ABNORMAL HIGH (ref <1.0)

## 2019-12-05 MED ORDER — PANTOPRAZOLE SODIUM 40 MG IV SOLR
40.0000 mg | Freq: Every day | INTRAVENOUS | Status: DC
  Administered 2019-12-05 – 2019-12-06 (×2): 40 mg via INTRAVENOUS
  Filled 2019-12-05 (×2): qty 40

## 2019-12-05 MED ORDER — ONDANSETRON HCL 4 MG/2ML IJ SOLN
4.0000 mg | Freq: Four times a day (QID) | INTRAMUSCULAR | Status: DC | PRN
  Administered 2019-12-05 – 2019-12-06 (×2): 4 mg via INTRAVENOUS
  Filled 2019-12-05 (×2): qty 2

## 2019-12-05 NOTE — Progress Notes (Signed)
PROGRESS NOTE   Mckenzie Rodriguez  ZDG:[id number]    DOB: December 15, 1930    DOA: 11/30/2019  PCP: Patient, No Pcp Per   I have briefly reviewed patients previous medical records in [facility].  Chief Complaint:   Chief Complaint  Patient presents with  . Covid/illness    Brief Narrative:  86 year old female, lives alone, PMH of type II DM, presented to ED due to 3-week history of food not tasting good, tasting better, thereby poor oral intake, approximately 10 pound weight loss, sustained fall of bed about a week PTA and bumped her head, seen by PCP few days PTA and prescribed mirtazapine for appetite and insomnia.  Admitted for COVID-19 infection with associated decreased appetite and hypoglycemia and small subdural hematoma managed conservatively per neurosurgery advice.   Assessment & Plan:  Principal Problem:   COVID-19 Active Problems:   Subdural hematoma (HCC)   T2DM (type 2 diabetes mellitus) (HCC)   Decreased appetite   Unintentional weight loss   Hyponatremia   COVID-19 infection  With associated lack of taste, decreased appetite, poor oral intake, dehydration, hypoglycemia and weakness leading to fall at home and small SDH.  Not hypoxic and hence no steroids initiated.  Ongoing IV remdesivir per pharmacy.  Procalcitonin normal and hence no antibiotics.  Dehydration with hyponatremia  Due to reasons above.  Ongoing poor oral intake.  Remains on IV fluid hydration.  Encouraged oral intake.  Hyponatremia resolved.  Nausea  Limiting oral intake as well.  Continue as needed Zofran, changed to IV.  Added PPI IV.  Possible thrush  Ongoing trial of Magic mouthwash, continue.  Acute kidney injury  Due to dehydration.  Resolved.  Subdural hematoma  CT head showed small subdural hematoma without mass-effect.  Neurosurgery consulted and recommended outpatient neurosurgery follow-up.  Not on routine seizure prophylaxis.  No focal deficits.  Type II DM  Had an episode  of hypoglycemia on 1/9.  Ongoing D5 infusion.  No further hypoglycemic episodes.  Home meds on hold.  Is on SSI.  Physical debility/deconditioning  Due to [id number] infection complicating advanced age and frailty.  PT evaluated and recommended home health PT at discharge.  Mild protein calorie malnutrition  Continue Glucerna and mirtazapine.  Consult dietitian.  Leukopenia  Likely due to COVID-19 infection.  Improving.  Thrombocytopenia  Likely due to COVID-19 infection.  Resolved.   Nutritional Status Nutrition Problem: Increased nutrient needs Etiology: acute illness(COVID-19 infection) Signs/Symptoms: estimated needs Interventions: Glucerna shake, MVI  DVT prophylaxis: SCDs Code Status: Full Family Communication: None at bedside. I discussed with patient's daughter 1/13, updated care and answered questions. Disposition: DC home pending clinical improvement.  Not medically ready for discharge.   Consultants:   Neurosurgery  Procedures:   None  Antimicrobials:   None   Subjective:  Patient interviewed and examined along with RN at bedside.  Ongoing poor oral intake due to lack of taste and intermittent nausea without vomiting.  As per nursing not really eating much and was up to bedside commode by herself this morning.  No other complaints reported at that time.  RN just page me indicating patient vomiting oral medications.  Objective:   Vitals:   12/04/19 1706 12/04/19 2015 12/04/19 2026 12/05/19 0738  BP: (!) 136/50 111/88 111/88 (!) 142/72  Pulse: 83 72 79 81  Resp: 20 18 20 16   Temp: 98.5 F (36.9 C) 97.7 F (36.5 C) 97.7 F (36.5 C) 98.8 F (37.1 C)  TempSrc: Oral Oral Oral Oral  SpO2: 96% 97%  98%  Weight:        General exam: Elderly female, moderately built and nourished, lethargic and ill looking lying comfortably propped up in bed without distress. Respiratory system: Clear to auscultation. Respiratory effort normal. Cardiovascular system: S1 &  S2 heard, RRR. No JVD, murmurs, rubs, gallops or clicks. No pedal edema.  Telemetry personally reviewed: Sinus rhythm with occasional PVCs. Gastrointestinal system: Abdomen is nondistended, soft and nontender. No organomegaly or masses felt. Normal bowel sounds heard. Central nervous system: Alert and oriented. No focal neurological deficits. Extremities: Symmetric 5 x 5 power. Skin: No rashes, lesions or ulcers Psychiatry: Judgement and insight appear normal. Mood & affect appropriate.     Data Reviewed:   I have personally reviewed following labs and imaging studies   CBC: Recent Labs  Lab 12/03/19 0431 12/04/19 0435 12/05/19 0345  WBC 2.5* 2.8* 3.2*  NEUTROABS 1.3* 1.6* 2.3  HGB 12.3 11.8* 12.1  HCT 37.3 34.8* 35.1*  MCV 87.1 86.8 86.2  PLT 148* 162 163    Basic Metabolic Panel: Recent Labs  Lab 12/03/19 0431 12/04/19 0435 12/05/19 0345  NA 136 136 139  K 3.7 3.5 3.5  CL 109 110 111  CO2 20* 18* 21*  GLUCOSE 106* 115* 106*  BUN 13 11 11   CREATININE 0.56 0.41* 0.51  CALCIUM 8.1* 7.9* 8.0*    Liver Function Tests: Recent Labs  Lab 12/03/19 0431 12/04/19 0435 12/05/19 0345  AST 23 22 22   ALT 11 11 12   ALKPHOS 44 42 49  BILITOT 0.8 0.8 0.8  PROT 6.2* 6.1* 5.8*  ALBUMIN 2.8* 2.6* 2.6*    CBG: Recent Labs  Lab 12/04/19 2015 12/05/19 0735 12/05/19 1226  GLUCAP 107* 102* 124*    Microbiology Studies:   Recent Results (from the past 240 hour(s))  SARS CORONAVIRUS 2 (TAT 6-24 HRS) Nasopharyngeal Nasopharyngeal Swab     Status: Abnormal   Collection Time: 11/30/19  7:17 PM   Specimen: Nasopharyngeal Swab  Result Value Ref Range Status   SARS Coronavirus 2 POSITIVE (A) NEGATIVE Final    Comment: RESULT CALLED TO, READ BACK BY AND VERIFIED WITH: RN BRENDA NICKELSON AT 0729 BY MESSAN HOUEGNIFIO ON 12/01/2019 (NOTE) SARS-CoV-2 target nucleic acids are DETECTED. The SARS-CoV-2 RNA is generally detectable in upper and lower respiratory specimens during  the acute phase of infection. Positive results are indicative of the presence of SARS-CoV-2 RNA. Clinical correlation with patient history and other diagnostic information is  necessary to determine patient infection status. Positive results do not rule out bacterial infection or co-infection with other viruses.  The expected result is Negative. Fact Sheet for Patients: 12/07/19 Fact Sheet for Healthcare Providers: 01/28/20 This test is not yet approved or cleared by the 01/29/2020 FDA and  has been authorized for detection and/or diagnosis of SARS-CoV-2 by FDA under an Emergency Use Authorization (EUA). This EUA will remain  in effect (meaning this  test can be used) for the duration of the COVID-19 declaration under Section 564(b)(1) of the Act, 21 U.S.C. section 360bbb-3(b)(1), unless the authorization is terminated or revoked sooner. Performed at [facility], [address] [place], [facility] [address]      Radiology Studies:  No results found.   Scheduled Meds:   . enalapril  10 mg Oral Daily  . feeding supplement (GLUCERNA SHAKE)  237 mL Oral TID BM  . gemfibrozil  600 mg Oral BID  . insulin aspart  0-9 Units Subcutaneous TID  WC  . mirtazapine  15 mg Oral QHS  . montelukast  10 mg Oral QHS  . multivitamin with minerals  1 tablet Oral Daily  . pravastatin  80 mg Oral q1800    Continuous Infusions:   . dextrose 5 % and 0.9% NaCl 100 mL/hr at 12/05/19 1031  . remdesivir 100 mg in NS 100 mL 100 mg (12/05/19 1027)     LOS: 4 days     Marcellus Scott, MD, [place], [facility]. [facility]    To contact the attending provider between 7A-7P or the covering provider during after hours 7P-7A, please log into the web site [email or web address] and access using universal [place] password for that web site. If you do not have the password, please call the hospital operator.  12/05/2019, 1:52 PM

## 2019-12-05 NOTE — Progress Notes (Signed)
Physical Therapy Treatment Patient Details Name: Mckenzie Rodriguez MRN: [id number] DOB: [birth year] Today's Date: 12/05/2019    History of Present Illness Pt is an 86yo female presenting with unintentional weight loss. Pt COVID+, hasn't eaten in 3 weeks due to food not tasting good, tasting bitter. Lost 10pounds in the last 3 weeks. Fell off bed about a week ago, ct of head found smalls ubdural hematoma. PMH: DM2    PT Comments    Patient progressing this session able to walk to bathroom with assist and help to use walker.  She remains very weak and has limited tolerance to activity, but was eager when asked to get up to change wet bed.  Feel she will need capable assist upon d/c for safety with mobility and follow up HHPT.   Follow Up Recommendations  Home health PT;Supervision/Assistance - 24 hour     Equipment Recommendations  Rolling walker with 5" wheels    Recommendations for Other Services       Precautions / Restrictions Precautions Precautions: Fall Precaution Comments: FTT Restrictions Weight Bearing Restrictions: No    Mobility  Bed Mobility Overal bed mobility: Needs Assistance Bed Mobility: Supine to Sit;Sit to Supine     Supine to sit: Mod assist;HOB elevated Sit to supine: Min assist   General bed mobility comments: assist for coming upright due to HOB up and pt slumped way down in bed, recliner in the way so helped legs off bed too, to supine assist for positioning  Transfers Overall transfer level: Needs assistance Equipment used: Rolling walker (2 wheeled);1 person hand held assist Transfers: Sit to/from Stand Sit to Stand: Min assist;Mod assist Stand pivot transfers: Mod assist       General transfer comment: up first time from EOB to pivot to recliner due to bed soiled with some lifting help,  back up to standing with RW for hygiene and donning new briefs with min A from recliner, then from toilet in bathroom min A for  steadying  Ambulation/Gait Ambulation/Gait assistance: Min assist;Mod assist Gait Distance (Feet): 12 Feet(x 2) Assistive device: Rolling walker (2 wheeled) Gait Pattern/deviations: Step-through pattern;Decreased stride length;Shuffle     General Gait Details: wanted to walk to bathroom, trying to leave the walker, but she is unsteady to cues and assist to keep walker close and maneuver around obstacles   Stairs             Wheelchair Mobility    Modified Rankin (Stroke Patients Only)       Balance Overall balance assessment: Needs assistance Sitting-balance support: Feet supported Sitting balance-Leahy Scale: Fair     Standing balance support: Bilateral upper extremity supported;During functional activity Standing balance-Leahy Scale: Poor Standing balance comment: UE support for balance                            Cognition Arousal/Alertness: Lethargic Behavior During Therapy: Flat affect Overall Cognitive Status: Within Functional Limits for tasks assessed                                 General Comments: not formally assessed, but responding appropriately when she can hear, reports my mask makes it hard for her to hear; knew she was in a wet bed      Exercises      General Comments General comments (skin integrity, edema, etc.): noted no SOB with ambulation, but pt  weak and still refusing even to drink water, RN reports got order for IV zofran to try and curb nausea as she threw up her pill earlier today.  Bed linen changed while pt up in recliner      Pertinent Vitals/Pain Pain Assessment: Faces Faces Pain Scale: Hurts little more Pain Location: generalized with mobility Pain Descriptors / Indicators: Grimacing;Discomfort Pain Intervention(s): Repositioned;Monitored during session    Home Living                      Prior Function            PT Goals (current goals can now be found in the care plan section)  Progress towards PT goals: Progressing toward goals    Frequency    Min 3X/week      PT Plan Current plan remains appropriate    Co-evaluation              AM-PAC PT "6 Clicks" Mobility   Outcome Measure  Help needed turning from your back to your side while in a flat bed without using bedrails?: A Little Help needed moving from lying on your back to sitting on the side of a flat bed without using bedrails?: A Lot Help needed moving to and from a bed to a chair (including a wheelchair)?: A Lot Help needed standing up from a chair using your arms (e.g., wheelchair or bedside chair)?: A Little Help needed to walk in hospital room?: A Little Help needed climbing 3-5 steps with a railing? : A Lot 6 Click Score: 15    End of Session   Activity Tolerance: Patient limited by fatigue Patient left: in bed;with call bell/phone within reach;with bed alarm set   PT Visit Diagnosis: Muscle weakness (generalized) (M62.81);Difficulty in walking, not elsewhere classified (R26.2)     Time: 1525-1550 PT Time Calculation (min) (ACUTE ONLY): 25 min  Charges:  $Gait Training: 8-22 mins $Therapeutic Activity: 8-22 mins                     Sheran Lawless, [place] [phone] 12/05/2019    Mckenzie Rodriguez 12/05/2019, 5:34 PM

## 2019-12-06 LAB — CBC
HCT: 34.8 % — ABNORMAL LOW (ref 36.0–46.0)
Hemoglobin: 11.9 g/dL — ABNORMAL LOW (ref 12.0–15.0)
MCH: 29 pg (ref 26.0–34.0)
MCHC: 34.2 g/dL (ref 30.0–36.0)
MCV: 84.9 fL (ref 80.0–100.0)
Platelets: 193 10*3/uL (ref 150–400)
RBC: 4.1 MIL/uL (ref 3.87–5.11)
RDW: 12.8 % (ref 11.5–15.5)
WBC: 3.2 10*3/uL — ABNORMAL LOW (ref 4.0–10.5)
nRBC: 0 % (ref 0.0–0.2)

## 2019-12-06 LAB — GLUCOSE, CAPILLARY
Glucose-Capillary: 104 mg/dL — ABNORMAL HIGH (ref 70–99)
Glucose-Capillary: 85 mg/dL (ref 70–99)
Glucose-Capillary: 99 mg/dL (ref 70–99)
Glucose-Capillary: 106 mg/dL — ABNORMAL HIGH (ref 70–99)

## 2019-12-06 LAB — GLUCOSE, RANDOM: Glucose, Bld: 110 mg/dL — ABNORMAL HIGH (ref 70–99)

## 2019-12-06 MED ORDER — PANTOPRAZOLE SODIUM 40 MG PO TBEC
40.0000 mg | DELAYED_RELEASE_TABLET | Freq: Every day | ORAL | Status: DC
  Administered 2019-12-07 – 2019-12-08 (×2): 40 mg via ORAL
  Filled 2019-12-06 (×2): qty 1

## 2019-12-06 NOTE — TOC Initial Note (Signed)
Transition of Care [facility]) - Initial/Assessment Note    Patient Details  Name: Mckenzie Rodriguez MRN: [id number] Date of Birth: 1931/05/20  [facility]) CM/SW Contact:    Lawerance Sabal, RN Phone Number: 12/06/2019, 11:31 AM  Clinical Narrative:               Could not reach patient on room phone, spoke w daughter. She states that her mom currently lives at home alone.  Her plan will be to move in with her when she is discharged, then when her lease is up in March, her mother will stay with her. Patient will have 24 hour supervision. We discussed support she would need at home, daughter is agreeable to [facility] services. We discussed Medicare ratings and she would like to use [facility]. Referral made to [facility].  CM will continue to follow for additional needs for DC.  Expected Discharge Plan: Home w Home Health Services Barriers to Discharge: Continued Medical Work up   Patient Goals and CMS Choice Patient states their goals for this hospitalization and ongoing recovery are:: to go home CMS Medicare.gov Compare Post Acute Care list provided to:: Other (Comment Required) Choice offered to / list presented to : Adult Children  Expected Discharge Plan and Services Expected Discharge Plan: Home w Home Health Services   Discharge Planning Services: CM Consult Post Acute Care Choice: Home Health, Durable Medical Equipment                             HH Arranged: RN, PT, OT, Nurse's Aide HH Agency: [facility] Date [facility] Agency Contacted: 12/06/19 Time HH Agency Contacted: 1131 Representative spoke with at [facility] Agency: Kandee Keen  Prior Living Arrangements/Services   Lives with:: Self                   Activities of Daily Living Home Assistive Devices/Equipment: None ADL Screening (condition at time of admission) Patient's cognitive ability adequate to safely complete daily activities?: Yes Is the patient deaf or have difficulty hearing?: No Does the patient have  difficulty seeing, even when wearing glasses/contacts?: No Does the patient have difficulty concentrating, remembering, or making decisions?: No Patient able to express need for assistance with ADLs?: Yes Does the patient have difficulty dressing or bathing?: No Independently performs ADLs?: Yes (appropriate for developmental age) Does the patient have difficulty walking or climbing stairs?: No Weakness of Legs: None Weakness of Arms/Hands: None  Permission Sought/Granted                  Emotional Assessment              Admission diagnosis:  Subdural hemorrhage (HCC) [I62.00] Subdural hematoma (HCC) [S06.5X9A] Unintentional weight loss [R63.4] Patient Active Problem List   Diagnosis Date Noted  . COVID-19 12/01/2019  . Subdural hematoma (HCC) 11/30/2019  . T2DM (type 2 diabetes mellitus) (HCC) 11/30/2019  . Decreased appetite 11/30/2019  . Unintentional weight loss 11/30/2019  . Hyponatremia 11/30/2019   PCP:  Patient, No Pcp Per Pharmacy:   [facility] [address], [place] - [address] [place] [address] Phone: [phone] Fax: [phone]     Social Determinants of Health (SDOH) Interventions    Readmission Risk Interventions No flowsheet data found.

## 2019-12-06 NOTE — Progress Notes (Signed)
Nutrition Follow-up  INTERVENTION:   If patient's PO intake remains poor, recommend Cortrak tube placement for nutrition support to provide additional kcals and protein given COVID-19 infection. Next service date: 1/15  -Recommend scheduled nausea medications prior to when pt eats meals -Continue Glucerna Shake po BID, each supplement provides 220 kcal and 10 grams of protein -Multivitamin with minerals daily  NUTRITION DIAGNOSIS:   Increased nutrient needs related to acute illness(COVID-19 infection) as evidenced by estimated needs.  Ongoing.  GOAL:   Patient will meet greater than or equal to 90% of their needs  Not meeting.  MONITOR:   PO intake, Supplement acceptance, Labs, Weight trends, I & O's  REASON FOR ASSESSMENT:   Consult Assessment of nutrition requirement/status  ASSESSMENT:   87 y.o. female with medical history significant for Type 2 diabetes presenting with nearly 4 week history of generalized weakness, poor appetite with decreased oral intake associated poor taste and bitter  Days in her mouth.  Patient lives at home by herself.  Seven prescribed mirtazapine for "appetite and insomnia" by PCP recently.  She had accidental fall about a week prior to presentation.  **RD working remotely**  Patient has continued to have poor PO d/t nausea when she tries to eat. 0-5% of meals documented. Pt is accepting ~2 Glucerna shakes , drinking sips.  Admission weight: 106 lbs. No new weights for admission.  I/Os: +5.7L since admit UOP: 600 ml x 24 hrs  Medications: Remeron tablet, Multivitamin with minerals daily Labs reviewed: CBGs: 97-104   Diet Order:   Diet Order            Diet heart healthy/carb modified Room service appropriate? Yes; Fluid consistency: Thin  Diet effective now              EDUCATION NEEDS:   No education needs have been identified at this time  Skin:  Skin Assessment: Reviewed RN Assessment  Last BM:  1/11 -type 6  Height:    Ht Readings from Last 1 Encounters:  No data found for Ht  Height: 4'10" per records in care everywhere  Weight:   Wt Readings from Last 1 Encounters:  12/03/19 48.2 kg    Ideal Body Weight:  44 kg(per height recorded in care everywhere)  BMI:  22.2 kg/m^2  Estimated Nutritional Needs:   Kcal:  1500-1700  Protein:  65-80g  Fluid:  1.7L/day  Tilda Franco, MS, RD, LDN Inpatient Clinical Dietitian Pager: [phone] After Hours Pager: [phone]

## 2019-12-06 NOTE — Progress Notes (Addendum)
PROGRESS NOTE   Mckenzie Rodriguez  STM:[id number]    DOB: 1931-01-29    DOA: 11/30/2019  PCP: Patient, No Pcp Per   I have briefly reviewed patients previous medical records in [facility].  Chief Complaint:   Chief Complaint  Patient presents with  . Covid/illness    Brief Narrative:  86 year old female, lives alone, PMH of type II DM, presented to ED due to 3-week history of food not tasting good, tasting better, thereby poor oral intake, approximately 10 pound weight loss, sustained fall of bed about a week PTA and bumped her head, seen by PCP few days PTA and prescribed mirtazapine for appetite and insomnia.  Admitted for COVID-19 infection with associated decreased appetite and hypoglycemia and small subdural hematoma managed conservatively per neurosurgery advice.  Ongoing decreased oral intake.   Assessment & Plan:  Principal Problem:   COVID-19 Active Problems:   Subdural hematoma (HCC)   T2DM (type 2 diabetes mellitus) (HCC)   Decreased appetite   Unintentional weight loss   Hyponatremia   COVID-19 infection  With associated lack of taste, decreased appetite, poor oral intake, dehydration, hypoglycemia and weakness leading to fall at home and small SDH.  Not hypoxic and hence no steroids initiated.  Ongoing IV remdesivir per pharmacy.  Procalcitonin normal and hence no antibiotics.  Hemodynamically and respiratory status stable.  Current biggest issue is her poor oral intake.  Dehydration with hyponatremia  Due to COVID-19 infection.  Ongoing poor oral intake.  Remains on IV fluid hydration.  Encouraged oral intake.  Hyponatremia resolved.  Nausea  Limiting oral intake as well.  Continue IV Zofran and IV PPI.  May give Zofran scheduled prior to each meal, encouraged oral intake.  RD input appreciated and recommends core track if patient oral intake does not improve.  Would like to avoid if possible.  Possible thrush  I examined patient's oral cavity in detail on  1/14 and did not appreciate any oral thrush.  Acute kidney injury  Due to dehydration.  Resolved.  Remains at risk for recurrence due to poor oral intake and hence continuing IV fluids.  Subdural hematoma  CT head showed small subdural hematoma without mass-effect.  Neurosurgery consulted and recommended outpatient neurosurgery follow-up.  Not on routine seizure prophylaxis.  No focal deficits.  Type II DM  Had an episode of hypoglycemia on 1/9.  Ongoing D5 infusion.  No further hypoglycemic episodes.  Home meds on hold.  Is on SSI.  CBGs stable.  Physical debility/deconditioning  Due to COVID-19 infection complicating advanced age and frailty.  PT evaluated and recommended home health PT at discharge.  Mild protein calorie malnutrition  Continue Glucerna and mirtazapine.  RD input 1/14 appreciated  Leukopenia  Likely due to COVID-19 infection.  Improving.  Thrombocytopenia  Likely due to COVID-19 infection.  Resolved.   Nutritional Status Nutrition Problem: Increased nutrient needs Etiology: acute illness(COVID-19 infection) Signs/Symptoms: estimated needs Interventions: Glucerna shake, MVI  DVT prophylaxis: SCDs Code Status: Full Family Communication: None at bedside. I discussed with patient's daughter 1/14, updated care and answered questions. She wants to be involved if we consider NGT or other tube feeding. Disposition: Patient admitted from home.  Plan would be to DC home with home health PT pending clinical improvement.  Current barrier is patient's inability to tolerate oral diet adequate enough to maintain safe nutrition.  Also completing treatment for COVID-19 infection.   Consultants:   Neurosurgery  Procedures:   None  Antimicrobials:   None  Subjective:  Had an episode of nonbloody emesis yesterday.  Antiemetics and PPI IV initiated.  No vomiting since.  Patient reports ongoing bitter taste and hence reluctant to take p.o. because of nausea.  No  abdominal pain or dyspnea reported.  Encouraged her to force herself to eat and advised her that if she does not then she will progressively get weaker.  She verbalized understanding.  Objective:   Vitals:   12/05/19 0738 12/05/19 1532 12/06/19 0017 12/06/19 0830  BP: (!) 142/72 134/66 (!) 120/54 (!) 142/33  Pulse: 81 91 81 71  Resp: 16 16    Temp: 98.8 F (37.1 C) 98.3 F (36.8 C) 97.9 F (36.6 C) 97.8 F (36.6 C)  TempSrc: Oral Oral Oral Axillary  SpO2: 98% 94% 96% 98%  Weight:        General exam: Elderly female, moderately built and nourished, lethargic and ill looking lying comfortably propped up in bed without distress.  Oral mucosa with borderline hydration.  No thrush appreciated. Respiratory system: Clear to auscultation.  No increased work of breathing Cardiovascular system: S1 & S2 heard, RRR. No JVD, murmurs, rubs, gallops or clicks. No pedal edema.  Telemetry personally reviewed: Sinus rhythm. Gastrointestinal system: Abdomen is nondistended, soft and nontender.  No organomegaly or masses appreciated.  Normal bowel sounds heard. Central nervous system: Alert and oriented. No focal neurological deficits. Extremities: Symmetric 5 x 5 power. Skin: No rashes, lesions or ulcers Psychiatry: Judgement and insight appear normal. Mood & affect flat.     Data Reviewed:   I have personally reviewed following labs and imaging studies   CBC: Recent Labs  Lab 12/03/19 0431 12/03/19 0431 12/04/19 0435 12/05/19 0345 12/06/19 0732  WBC 2.5*   < > 2.8* 3.2* 3.2*  NEUTROABS 1.3*  --  1.6* 2.3  --   HGB 12.3   < > 11.8* 12.1 11.9*  HCT 37.3   < > 34.8* 35.1* 34.8*  MCV 87.1   < > 86.8 86.2 84.9  PLT 148*   < > 162 163 193   < > = values in this interval not displayed.    Basic Metabolic Panel: Recent Labs  Lab 12/03/19 0431 12/03/19 0431 12/04/19 0435 12/05/19 0345 12/06/19 0732  NA 136  --  136 139  --   K 3.7  --  3.5 3.5  --   CL 109  --  110 111  --   CO2  20*  --  18* 21*  --   GLUCOSE 106*   < > 115* 106* 110*  BUN 13  --  11 11  --   CREATININE 0.56  --  0.41* 0.51  --   CALCIUM 8.1*  --  7.9* 8.0*  --    < > = values in this interval not displayed.    Liver Function Tests: Recent Labs  Lab 12/03/19 0431 12/04/19 0435 12/05/19 0345  AST 23 22 22   ALT 11 11 12   ALKPHOS 44 42 49  BILITOT 0.8 0.8 0.8  PROT 6.2* 6.1* 5.8*  ALBUMIN 2.8* 2.6* 2.6*    CBG: Recent Labs  Lab 12/05/19 1733 12/05/19 2030 12/06/19 0828  GLUCAP 122* 97 104*    Microbiology Studies:   Recent Results (from the past 240 hour(s))  SARS CORONAVIRUS 2 (TAT 6-24 HRS) Nasopharyngeal Nasopharyngeal Swab     Status: Abnormal   Collection Time: 11/30/19  7:17 PM   Specimen: Nasopharyngeal Swab  Result Value Ref Range Status   SARS  Coronavirus 2 POSITIVE (A) NEGATIVE Final    Comment: RESULT CALLED TO, READ BACK BY AND VERIFIED WITH: RN BRENDA NICKELSON AT 0729 BY MESSAN HOUEGNIFIO ON 12/01/2019 (NOTE) SARS-CoV-2 target nucleic acids are DETECTED. The SARS-CoV-2 RNA is generally detectable in upper and lower respiratory specimens during the acute phase of infection. Positive results are indicative of the presence of SARS-CoV-2 RNA. Clinical correlation with patient history and other diagnostic information is  necessary to determine patient infection status. Positive results do not rule out bacterial infection or co-infection with other viruses.  The expected result is Negative. Fact Sheet for Patients: SugarRoll.be Fact Sheet for Healthcare Providers: https://www.woods-mathews.com/ This test is not yet approved or cleared by the [place] FDA and  has been authorized for detection and/or diagnosis of SARS-CoV-2 by FDA under an Emergency Use Authorization (EUA). This EUA will remain  in effect (meaning this  test can be used) for the duration of the COVID-19 declaration under Section 564(b)(1) of the Act, 21  U.S.C. section 360bbb-3(b)(1), unless the authorization is terminated or revoked sooner. Performed at [facility], [place] [address], [place], [place] [address]      Radiology Studies:  No results found.   Scheduled Meds:   . enalapril  10 mg Oral Daily  . feeding supplement (GLUCERNA SHAKE)  237 mL Oral TID BM  . gemfibrozil  600 mg Oral BID  . insulin aspart  0-9 Units Subcutaneous TID WC  . mirtazapine  15 mg Oral QHS  . montelukast  10 mg Oral QHS  . multivitamin with minerals  1 tablet Oral Daily  . pantoprazole (PROTONIX) IV  40 mg Intravenous Daily  . pravastatin  80 mg Oral q1800    Continuous Infusions:   . dextrose 5 % and 0.9% NaCl 75 mL/hr at 12/05/19 2205     LOS: 5 days     Vernell Leep, MD, [place], [facility]. [facility]    To contact the attending provider between 7A-7P or the covering provider during after hours 7P-7A, please log into the web site [email or web address] and access using universal [place] password for that web site. If you do not have the password, please call the hospital operator.  12/06/2019, 11:21 AM

## 2019-12-06 NOTE — Progress Notes (Signed)
Patient refusing all PO's, stating it taste bitter. This RN educated her on the importance of eating to gain her strength but she refuses to try. Daughter, Sheralyn Boatman, called for update and wanted to speak to her mom. Went in patients room and informed patient daughter wants to speak with her. Handed patient the phone and she refused to talk to daughter stating she cant hold phone and all her daughter says is eat eat eat. Will continue to encourage patient to eat and drink glucerna.

## 2019-12-07 LAB — CBC
HCT: 34 % — ABNORMAL LOW (ref 36.0–46.0)
Hemoglobin: 11.7 g/dL — ABNORMAL LOW (ref 12.0–15.0)
MCH: 28.9 pg (ref 26.0–34.0)
MCHC: 34.4 g/dL (ref 30.0–36.0)
MCV: 84 fL (ref 80.0–100.0)
Platelets: 206 10*3/uL (ref 150–400)
RBC: 4.05 MIL/uL (ref 3.87–5.11)
RDW: 12.8 % (ref 11.5–15.5)
WBC: 4.1 10*3/uL (ref 4.0–10.5)
nRBC: 0 % (ref 0.0–0.2)

## 2019-12-07 LAB — COMPREHENSIVE METABOLIC PANEL
ALT: 12 U/L (ref 0–44)
AST: 21 U/L (ref 15–41)
Albumin: 2.4 g/dL — ABNORMAL LOW (ref 3.5–5.0)
Alkaline Phosphatase: 50 U/L (ref 38–126)
Anion gap: 6 (ref 5–15)
BUN: 8 mg/dL (ref 8–23)
CO2: 21 mmol/L — ABNORMAL LOW (ref 22–32)
Calcium: 7.8 mg/dL — ABNORMAL LOW (ref 8.9–10.3)
Chloride: 110 mmol/L (ref 98–111)
Creatinine, Ser: 0.56 mg/dL (ref 0.44–1.00)
GFR calc Af Amer: >60 mL/min (ref >60)
GFR calc non Af Amer: >60 mL/min (ref >60)
Glucose, Bld: 137 mg/dL — ABNORMAL HIGH (ref 70–99)
Potassium: 3 mmol/L — ABNORMAL LOW (ref 3.5–5.1)
Sodium: 137 mmol/L (ref 135–145)
Total Bilirubin: 0.5 mg/dL (ref 0.3–1.2)
Total Protein: 5.7 g/dL — ABNORMAL LOW (ref 6.5–8.1)

## 2019-12-07 LAB — GLUCOSE, CAPILLARY
Glucose-Capillary: 134 mg/dL — ABNORMAL HIGH (ref 70–99)
Glucose-Capillary: 161 mg/dL — ABNORMAL HIGH (ref 70–99)
Glucose-Capillary: 138 mg/dL — ABNORMAL HIGH (ref 70–99)
Glucose-Capillary: 113 mg/dL — ABNORMAL HIGH (ref 70–99)

## 2019-12-07 LAB — MAGNESIUM: Magnesium: 1.7 mg/dL (ref 1.7–2.4)

## 2019-12-07 MED ORDER — ENSURE ENLIVE PO LIQD
237.0000 mL | Freq: Three times a day (TID) | ORAL | Status: DC
  Administered 2019-12-07 – 2019-12-08 (×4): 237 mL via ORAL

## 2019-12-07 MED ORDER — ENOXAPARIN SODIUM 40 MG/0.4ML ~~LOC~~ SOLN
40.0000 mg | Freq: Every day | SUBCUTANEOUS | Status: DC
  Administered 2019-12-07 – 2019-12-08 (×2): 40 mg via SUBCUTANEOUS
  Filled 2019-12-07 (×2): qty 0.4

## 2019-12-07 MED ORDER — POTASSIUM CHLORIDE CRYS ER 20 MEQ PO TBCR
40.0000 meq | EXTENDED_RELEASE_TABLET | Freq: Once | ORAL | Status: AC
  Administered 2019-12-07: 40 meq via ORAL
  Filled 2019-12-07: qty 2

## 2019-12-07 MED ORDER — ENSURE ENLIVE PO LIQD: 237.0000 mL | Freq: Three times a day (TID) | ORAL | Status: DC

## 2019-12-07 NOTE — Progress Notes (Signed)
Physical Therapy Treatment Patient Details Name: Mckenzie Rodriguez MRN: [id number] DOB: [birth year] Today's Date: 12/07/2019    History of Present Illness Pt is an 86yo female presenting with unintentional weight loss. Pt COVID+, hasn't eaten in 3 weeks due to food not tasting good, tasting bitter. Lost 10pounds in the last 3 weeks. Fell off bed about a week ago, ct of head found smalls ubdural hematoma. PMH: DM2    PT Comments    Patient progressing some with mobility able to sit up on EOB without physical help, and though remains motivated to get up to mobilize to bathroom, did not really want to stay OOB in recliner but agreed at my insistence for at least 30 min.  She will continue to benefit from skilled PT in the acute setting and follow up HHPT with daughter's assist at d/c.    Follow Up Recommendations  Home health PT;Supervision/Assistance - 24 hour     Equipment Recommendations  Rolling walker with 5" wheels    Recommendations for Other Services       Precautions / Restrictions Precautions Precautions: Fall Precaution Comments: FTT Restrictions Weight Bearing Restrictions: No    Mobility  Bed Mobility Overal bed mobility: Needs Assistance       Supine to sit: Supervision     General bed mobility comments: increased time with rail, but able to sit up without physical help  Transfers Overall transfer level: Needs assistance Equipment used: Rolling walker (2 wheeled) Transfers: Sit to/from Stand Sit to Stand: Min guard         General transfer comment: assist for balance, standing up from bed, then toilet  Ambulation/Gait Ambulation/Gait assistance: Min guard;Min assist Gait Distance (Feet): 12 Feet(x 2) Assistive device: Rolling walker (2 wheeled) Gait Pattern/deviations: Step-to pattern;Step-through pattern;Decreased stride length     General Gait Details: assist for walker especially in small bathroom with IV pole.  Initiated to try without RW, but pt  too unsteady   Social research officer, [employer] (Stroke Patients Only)       Balance Overall balance assessment: Needs assistance Sitting-balance support: Feet supported Sitting balance-Leahy Scale: Fair     Standing balance support: During functional activity;No upper extremity supported Standing balance-Leahy Scale: Poor Standing balance comment: pulling up mesh panties in bathroom with assist for safety; washed hands at sink with minguard for balance                            Cognition Arousal/Alertness: Awake/alert Behavior During Therapy: WFL for tasks assessed/performed Overall Cognitive Status: Within Functional Limits for tasks assessed                                 General Comments: more interactive this session, reports ate the most she has eaten today, energetic to get up to the bathroom, but needed encouragement to sit up in chair a bit, wanted to go right back to bed      Exercises      General Comments        Pertinent Vitals/Pain Pain Assessment: Faces Faces Pain Scale: Hurts little more Pain Location: lower legs with ankle pumps, back with sitting Pain Descriptors / Indicators: Grimacing;Discomfort Pain Intervention(s): Repositioned;Monitored during session    Home Living  Prior Function            PT Goals (current goals can now be found in the care plan section) Progress towards PT goals: Progressing toward goals    Frequency    Min 3X/week      PT Plan Current plan remains appropriate    Co-evaluation              AM-PAC PT "6 Clicks" Mobility   Outcome Measure  Help needed turning from your back to your side while in a flat bed without using bedrails?: A Little Help needed moving from lying on your back to sitting on the side of a flat bed without using bedrails?: A Little Help needed moving to and from a bed to a chair (including a  wheelchair)?: A Little Help needed standing up from a chair using your arms (e.g., wheelchair or bedside chair)?: A Little Help needed to walk in hospital room?: A Little Help needed climbing 3-5 steps with a railing? : A Lot 6 Click Score: 17    End of Session   Activity Tolerance: Patient limited by fatigue Patient left: in chair;with call bell/phone within reach Nurse Communication: Mobility status(will call for back to bed, likely 30 minutes) PT Visit Diagnosis: Muscle weakness (generalized) (M62.81);Difficulty in walking, not elsewhere classified (R26.2)     Time: 0254-2706 PT Time Calculation (min) (ACUTE ONLY): 20 min  Charges:  $Gait Training: 8-22 mins                     Magda Kiel, [place] [phone] 12/07/2019    Reginia Naas 12/07/2019, 6:09 PM

## 2019-12-07 NOTE — Progress Notes (Signed)
Nutrition Follow-up  DOCUMENTATION CODES:   Not applicable  INTERVENTION:   Discontinue Glucerna  Order Ensure Enlive QID due to pt's continued poor PO intake (Glucerna does not have sufficient calories/protein to meet pt's nutritional needs)  Continue MVI with minerals  Liberalize diet to regular to increase PO intake  Educated pt on importance of adequate nutrition   Should consider Cortrak again if pt's PO intake remains poor   NUTRITION DIAGNOSIS:   Increased nutrient needs related to acute illness(COVID-19 infection) as evidenced by estimated needs.  Ongoing.  GOAL:   Patient will meet greater than or equal to 90% of their needs  Not meeting.  MONITOR:   PO intake, Supplement acceptance, Labs, Weight trends, I & O's  REASON FOR ASSESSMENT:   Consult Assessment of nutrition requirement/status  ASSESSMENT:   Pt is an 86 year old female with a PMH significant for DM presenting with 3-4 week hx of generalized weakness, poor appetite with decreased PO intake (C/O bitter taste in mouth), recent fall (1 week PTA), and recent unintentional wt loss of ~10 lbs. Pt was prescribed mirtazapine for appetite and insomnia a few days PTA.  Current wt is 48.2 kg, no updated wt since admission. Pt noted to have a 10lb wt loss over period of 3-4 weeks PTA, indicating a clinically significant 8.6% wt loss.  Pt's daughter reports recent wt loss could be due to stress related to upcoming move as pt experienced similar decreased appetite and wt loss during her last big move. Pt is documented to be consuming 0-5% of meals and accepting sips of Glucerna supplements (currently receives TID). Pt is reporting ongoing bitter taste and nausea causing reluctance to take PO.   Discussed possibility of Cortrak placement with MD and with pt. Pt does not wish to receive a feeding tube at this time and expressed interest in increasing her PO intake to avoid feeding tube. Per MD, the pt has stated  that she will "force herself to eat." Discussed liberalizing diet with MD to increase PO intake. Educated pt on importance of consuming adequate protein and calories to increase strength and to help her feel better. Per RN, the pt still ate only  5% of her breakfast this morning, but she consumed 100% of her Glucerna. Discussed switching to Ensure Enlive due to its higher calorie and protein content, pt agreeable.   Medications reviewed and include: SSI, Remeron, MVI with minerals  Labs reviewed: Potassium 3.0 (L), CBG 85-134  UOP: 1,065mL x24 hours I/O: 4,943.54mL since admit   NUTRITION - FOCUSED PHYSICAL EXAM:  deferred  Diet Order:   Diet Order            Diet heart healthy/carb modified Room service appropriate? Yes; Fluid consistency: Thin  Diet effective now              EDUCATION NEEDS:   Education needs have been addressed  Skin:  Skin Assessment: Reviewed RN Assessment  Last BM:  unknown  Height:   Ht Readings from Last 1 Encounters:  No data found for Ht  Height: 4'10" per records in care everywhere  Weight:   Wt Readings from Last 1 Encounters:  12/03/19 48.2 kg    Ideal Body Weight:  44 kg(per height recorded in care everywhere)  BMI:  There is no height or weight on file to calculate BMI. 22.2kg/m^2 (per height recorded in care everywhere)  Estimated Nutritional Needs:   Kcal:  1500-1700  Protein:  65-80g  Fluid:  1.7L/day  Eugene Gavia, MS, RD, LDN Pager: [phone] Weekend/After Hours Pager: [phone]

## 2019-12-07 NOTE — Progress Notes (Addendum)
PROGRESS NOTE   Mckenzie Rodriguez  LDJ:[id number]    DOB: 10/02/1931    DOA: 11/30/2019  PCP: Patient, No Pcp Per   I have briefly reviewed patients previous medical records in [facility].  Chief Complaint:   Chief Complaint  Patient presents with  . Covid/illness    Brief Narrative:  86 year old female, lives alone, PMH of type II DM, presented to ED due to 3-week history of food not tasting good, tasting better, thereby poor oral intake, approximately 10 pound weight loss, sustained fall of bed about a week PTA and bumped her head, seen by PCP few days PTA and prescribed mirtazapine for appetite and insomnia.  Admitted for COVID-19 infection with associated decreased appetite and hypoglycemia and small subdural hematoma managed conservatively per neurosurgery advice.  Ongoing very poor oral intake.  RD recommends tube feeding by core track.  I discussed with patient, not sure if she fully understands.  I had discussed yesterday with patient's daughter in detail, need to follow-up.   Assessment & Plan:  Principal Problem:   COVID-19 Active Problems:   Subdural hematoma (HCC)   T2DM (type 2 diabetes mellitus) (HCC)   Decreased appetite   Unintentional weight loss   Hyponatremia   COVID-19 infection  With associated lack of taste, decreased appetite, poor oral intake, dehydration, hypoglycemia and weakness leading to fall at home and small SDH.  Not hypoxic and hence no steroids initiated.  Ongoing IV remdesivir per pharmacy.  Procalcitonin normal and hence no antibiotics.  Hemodynamically and respiratory status stable.  Current biggest issue is her poor oral intake, same continues despite encouragement from medical personnel and patient's daughter.  If patient unable to take adequate p.o. to sustain nutrition, available options include brief tube feeding via cor track versus consideration for palliative oriented care if she declines tube feeding.  I had discussed this in detail with  patient's daughter yesterday.  Attempted to discuss with patient but not sure if she truly understands.  Will need to follow-up with daughter regarding their plans.  Advised RD also to speak with daughter.  Dehydration with hyponatremia  Due to COVID-19 infection.  Ongoing poor oral intake.  Remains on IV fluid hydration.  Encouraged oral intake.  Hyponatremia resolved.  Nausea  Limiting oral intake as well.  Continue IV Zofran and IV PPI.  May give Zofran scheduled prior to each meal, encouraged oral intake.  RD input appreciated and recommends core track if patient oral intake does not improve.  As per RD, taking only 0-5% of meals since admission.   Possible thrush  I examined patient's oral cavity in detail on 1/14 and did not appreciate any oral thrush.  Acute kidney injury  Due to dehydration.  Resolved.  Remains at risk for recurrence due to poor oral intake and hence continuing IV fluids.  Subdural hematoma  CT head showed small subdural hematoma without mass-effect.  Neurosurgery consulted and recommended outpatient neurosurgery follow-up.  Not on routine seizure prophylaxis.  No focal deficits.  Type II DM  Had an episode of hypoglycemia on 1/9.  Ongoing D5 infusion.  No further hypoglycemic episodes.  Home meds on hold.  Is on SSI.  CBGs stable.  Physical debility/deconditioning  Due to COVID-19 infection complicating 86 years old and frailty.  PT evaluated and recommended home health PT at discharge.  Mild protein calorie malnutrition  Continue Glucerna and mirtazapine.  RD input 1/14 appreciated  Leukopenia  Likely due to COVID-19 infection.  Improving.  Thrombocytopenia  Likely due to COVID-19 infection.  Resolved.   Hypokalemia  Replace and follow.  Follow magnesium.  Adult failure to thrive  Due to COVID-19 infection with associated poor oral intake complicating 86 years old, fraility and multiple comorbidities as above.  PMT consulted for goals of  care.   Nutritional Status Nutrition Problem: Increased nutrient needs Etiology: acute illness(COVID-19 infection) Signs/Symptoms: estimated needs Interventions: Glucerna shake, MVI  DVT prophylaxis: SCDs Code Status: Full Family Communication: None at bedside. I discussed with patient's daughter 1/14, updated care and answered questions. She wants to be involved if we consider NGT or other tube feeding. Disposition: Patient admitted from home.  Plan would be to DC home with home health PT pending clinical improvement.  Current barrier is patient's inability to tolerate oral diet adequate enough to maintain safe nutrition.  Also completing treatment for COVID-19 infection.   Consultants:   Neurosurgery  Procedures:   None  Antimicrobials:   None   Subjective:  Patient states that she feels more hungry and ate little more overnight although this is not confirmed with nursing.  Denied nausea or vomiting.  No other complaints reported.  Objective:   Vitals:   12/06/19 0830 12/06/19 1614 12/06/19 2059 12/07/19 0800  BP: (!) 142/33 (!) 152/70 137/61 (!) 139/57  Pulse: 71 70 73 69  Resp:      Temp: 97.8 F (36.6 C) 98.4 F (36.9 C) 97.8 F (36.6 C) 97.8 F (36.6 C)  TempSrc: Axillary Oral Oral Oral  SpO2: 98% 99% 96% 96%  Weight:        General exam: Elderly female, moderately built and nourished, lethargic and ill looking lying comfortably propped up in bed without distress.  Oral mucosa with borderline hydration.  No thrush appreciated. Respiratory system: Clear to auscultation.  No increased work of breathing. Cardiovascular system: S1 & S2 heard, RRR. No JVD, murmurs, rubs, gallops or clicks. No pedal edema.  Stable without change. Gastrointestinal system: Abdomen is nondistended, soft and nontender.  No organomegaly or masses appreciated.  Normal bowel sounds heard. Central nervous system: Alert and oriented. No focal neurological deficits. Extremities: Symmetric 5 x  5 power. Skin: No rashes, lesions or ulcers Psychiatry: Judgement and insight appear normal. Mood & affect flat.     Data Reviewed:   I have personally reviewed following labs and imaging studies   CBC: Recent Labs  Lab 12/03/19 0431 12/03/19 0431 12/04/19 0435 12/04/19 0435 12/05/19 0345 12/06/19 0732 12/07/19 0614  WBC 2.5*   < > 2.8*   < > 3.2* 3.2* 4.1  NEUTROABS 1.3*  --  1.6*  --  2.3  --   --   HGB 12.3   < > 11.8*   < > 12.1 11.9* 11.7*  HCT 37.3   < > 34.8*   < > 35.1* 34.8* 34.0*  MCV 87.1   < > 86.8   < > 86.2 84.9 84.0  PLT 148*   < > 162   < > 163 193 206   < > = values in this interval not displayed.    Basic Metabolic Panel: Recent Labs  Lab 12/04/19 0435 12/04/19 0435 12/05/19 0345 12/06/19 0732 12/07/19 0614  NA 136  --  139  --  137  K 3.5  --  3.5  --  3.0*  CL 110  --  111  --  110  CO2 18*  --  21*  --  21*  GLUCOSE 115*   < > 106* 110* 137*  BUN 11  --  11  --  8  CREATININE 0.41*  --  0.51  --  0.56  CALCIUM 7.9*  --  8.0*  --  7.8*   < > = values in this interval not displayed.    Liver Function Tests: Recent Labs  Lab 12/04/19 0435 12/05/19 0345 12/07/19 0614  AST 22 22 21   ALT 11 12 12   ALKPHOS 42 49 50  BILITOT 0.8 0.8 0.5  PROT 6.1* 5.8* 5.7*  ALBUMIN 2.6* 2.6* 2.4*    CBG: Recent Labs  Lab 12/06/19 2055 12/07/19 0830 12/07/19 1132  GLUCAP 106* 134* 161*    Microbiology Studies:   Recent Results (from the past 240 hour(s))  SARS CORONAVIRUS 2 (TAT 6-24 HRS) Nasopharyngeal Nasopharyngeal Swab     Status: Abnormal   Collection Time: 11/30/19  7:17 PM   Specimen: Nasopharyngeal Swab  Result Value Ref Range Status   SARS Coronavirus 2 POSITIVE (A) NEGATIVE Final    Comment: RESULT CALLED TO, READ BACK BY AND VERIFIED WITH: RN BRENDA NICKELSON AT 0729 BY MESSAN HOUEGNIFIO ON 12/01/2019 (NOTE) SARS-CoV-2 target nucleic acids are DETECTED. The SARS-CoV-2 RNA is generally detectable in upper and lower respiratory  specimens during the acute phase of infection. Positive results are indicative of the presence of SARS-CoV-2 RNA. Clinical correlation with patient history and other diagnostic information is  necessary to determine patient infection status. Positive results do not rule out bacterial infection or co-infection with other viruses.  The expected result is Negative. Fact Sheet for Patients: 01/28/20 Fact Sheet for Healthcare Providers: 01/29/2020 This test is not yet approved or cleared by the [employer] FDA and  has been authorized for detection and/or diagnosis of SARS-CoV-2 by FDA under an Emergency Use Authorization (EUA). This EUA will remain  in effect (meaning this  test can be used) for the duration of the COVID-19 declaration under Section 564(b)(1) of the Act, 21 U.S.C. section 360bbb-3(b)(1), unless the authorization is terminated or revoked sooner. Performed at [facility], [address] [place], [address] [place]      Radiology Studies:  No results found.   Scheduled Meds:   . enalapril  10 mg Oral Daily  . enoxaparin (LOVENOX) injection  40 mg Subcutaneous Daily  . feeding supplement (GLUCERNA SHAKE)  237 mL Oral TID BM  . gemfibrozil  600 mg Oral BID  . insulin aspart  0-9 Units Subcutaneous TID WC  . mirtazapine  15 mg Oral QHS  . montelukast  10 mg Oral QHS  . multivitamin with minerals  1 tablet Oral Daily  . pantoprazole  40 mg Oral Q1200  . pravastatin  80 mg Oral q1800    Continuous Infusions:   . dextrose 5 % and 0.9% NaCl 100 mL/hr at 12/07/19 0931     LOS: 6 days     [id number], MD, [place], [facility]. [facility]    To contact the attending provider between 7A-7P or the covering provider during after hours 7P-7A, please log into the web site [email or web address] and access using universal [place] password for that web site. If you do not have the password, please call  the hospital operator.  12/07/2019, 11:43 AM

## 2019-12-08 DIAGNOSIS — Z515 Encounter for palliative care: Secondary | ICD-10-CM

## 2019-12-08 DIAGNOSIS — E871 Hypo-osmolality and hyponatremia: Secondary | ICD-10-CM

## 2019-12-08 DIAGNOSIS — Z7189 Other specified counseling: Secondary | ICD-10-CM

## 2019-12-08 DIAGNOSIS — R29898 Other symptoms and signs involving the musculoskeletal system: Secondary | ICD-10-CM

## 2019-12-08 DIAGNOSIS — K117 Disturbances of salivary secretion: Secondary | ICD-10-CM

## 2019-12-08 DIAGNOSIS — R627 Adult failure to thrive: Secondary | ICD-10-CM

## 2019-12-08 LAB — GLUCOSE, CAPILLARY
Glucose-Capillary: 128 mg/dL — ABNORMAL HIGH (ref 70–99)
Glucose-Capillary: 152 mg/dL — ABNORMAL HIGH (ref 70–99)

## 2019-12-08 LAB — BASIC METABOLIC PANEL
Anion gap: 8 (ref 5–15)
BUN: 5 mg/dL — ABNORMAL LOW (ref 8–23)
CO2: 21 mmol/L — ABNORMAL LOW (ref 22–32)
Calcium: 7.9 mg/dL — ABNORMAL LOW (ref 8.9–10.3)
Chloride: 107 mmol/L (ref 98–111)
Creatinine, Ser: 0.4 mg/dL — ABNORMAL LOW (ref 0.44–1.00)
GFR calc Af Amer: >60 mL/min (ref >60)
GFR calc non Af Amer: >60 mL/min (ref >60)
Glucose, Bld: 134 mg/dL — ABNORMAL HIGH (ref 70–99)
Potassium: 3.1 mmol/L — ABNORMAL LOW (ref 3.5–5.1)
Sodium: 136 mmol/L (ref 135–145)

## 2019-12-08 MED ORDER — PANTOPRAZOLE SODIUM 40 MG PO TBEC: 40.0000 mg | DELAYED_RELEASE_TABLET | Freq: Every day | ORAL | 0 refills | Status: AC

## 2019-12-08 MED ORDER — ENSURE ENLIVE PO LIQD: 237.0000 mL | Freq: Three times a day (TID) | ORAL | 12 refills | Status: AC

## 2019-12-08 MED ORDER — ADULT MULTIVITAMIN W/MINERALS CH: 1.0000 | ORAL_TABLET | Freq: Every day | ORAL | Status: AC

## 2019-12-08 MED ORDER — POTASSIUM CHLORIDE CRYS ER 20 MEQ PO TBCR
40.0000 meq | EXTENDED_RELEASE_TABLET | Freq: Once | ORAL | Status: AC
  Administered 2019-12-08: 40 meq via ORAL
  Filled 2019-12-08: qty 2

## 2019-12-08 NOTE — Discharge Instructions (Signed)

## 2019-12-08 NOTE — Care Management (Signed)
Spoke w daughter Sheralyn Boatman who is on her way to the hospital to provide transportation home for patient. She states that she would prefer to pick up walker at [facility] over waiting 3 hours to get one delivered to room.  [place] notified of DC.

## 2019-12-08 NOTE — Consult Note (Signed)
Consultation Note Date: 12/08/2019   Patient Name: Mckenzie Rodriguez  DOB: 06-14-1931  MRN: [id number]  Age / Sex: 86 y.o., female  PCP: Patient, No Pcp Per Referring Physician: Elease Etienne, MD  Reason for Consultation: Establishing goals of care, discussion of feeding tube  HPI/Patient Profile:  86 year old female, lives alone, PMH of type II DM, presented to ED due to 3-week history of food not tasting good, tasting better, thereby poor oral intake, approximately 10 pound weight loss, sustained fall of bed about a week PTA and bumped her head, seen by PCP few days PTA and prescribed mirtazapine for appetite and insomnia.  Admitted for COVID-19 infection with associated decreased appetite and hypoglycemia and small subdural hematoma managed conservatively per neurosurgery advice.  Ongoing very poor oral intake.  RD recommends tube feeding by core track.  I discussed with patient, not sure if she fully understands.  I had discussed yesterday with patient's daughter in detail, need to follow-up.  Palliative Care consulted to help with goals of care conversations.   Clinical Assessment and Goals of Care: Chart reviewed. Spoke to patients bedside RN, Duwayne Heck.   Introduced self to patient as a member of the Palliative care team. Explained what Palliative care is. Ask the patient what her impression of her health was. Mckenzie Rodriguez shared that she has had a poor appetite and cannot taste food. We dicussed COVID-19 and it's affect on appetite in patients. Asked her if anyone had told her their concerns if she were unable to eat adequately. She stated that she would not want a "tube". She emphasized that she will try to start eating and drinking more. Went over tube feedings and their purpose in instances whereby people cannot provide themselves adequate nutrition. Patient said that she does not want this. Asked me to  further speak to her daughter. Ended by telling Mckenzie Rodriguez it will be extremely important for her to get up and on her feet which she seems highly motivated to do.   Called Sheralyn Boatman (daughter) later in the morning. Explained my role as a Palliative care provider. I asked Sheralyn Boatman if she had thought about the option of a feeding tube more after sharing that her mother clearly said she would not want this. Sheralyn Boatman stated that she spoke with her mother yesterday in detail. They discussed a feeding tube and after conversation they both feel that it is not a good idea. Sheralyn Boatman hopes that she can take the patient home where she will provide 24/7 care for her and encourage her to eat/drink regularly. Sheralyn Boatman is taking medical leave from work in an effort to be present for her mother. Sheralyn Boatman said that she does not feel the patient is ready for hospice (unillicited) and would like to see how she fairs once out of the hospital. Helene Kelp to bring in appetizing foods for th patient from home. She plans to bring mashed potatoes today.   Brought up patients code status and inquired further over whether or not she feels resuscitation is the  best option for her mother. Vivien Rota stated that this is a hard discussion but one she knows that she needs to have. I asked her to consider speaking about this with her mother and sister in an effort to avoid a harder decision down the road. Provided information on looking up the MOST form to aid in conversation. Offered outpatient Palliative care follow up to help facilitate these hard conversations. Vivien Rota is thinking about if she would want outpatient follow up.   We will continue to follow.   Decision Maker: Daylene Posey (daughter) - [phone]  SUMMARY OF RECOMMENDATIONS   Patient presently remains full code No feeding tubes  Code Status/Advance Care Planning:  Full code  Symptom Management:  Failure to Thrive:  - Appreciate nutrition consult  - 1:1 feedings  - Provide appetizing  foods, patient enjoys mashed potatoes and egg drop soup  - Ensure TID                   Muscular Weakness:                 - PT                 - OT   Xerostomia:                 - Good oral care QShift                 - Encourage liquid intake   Dispo:                 - Plan to transition home with [place]. Patients daughter, Vivien Rota will provide 24/7 care Palliative Prophylaxis:   Aspiration, Bowel Regimen, Delirium Protocol, Eye Care, Frequent Pain Assessment and Oral Care  Additional Recommendations (Limitations, Scope, Preferences):  Full Scope Treatment  Psycho-social/Spiritual:   Desire for further Chaplaincy support: Yes  Additional Recommendations: Caregiving  Support/Resources  Prognosis:   Will largely depend on patient ability to start eating, has been making progress. If able to recover from [place] could like for years.   Discharge Planning: Hospice facility     Primary Diagnoses: I have reviewed the medical record, interviewed the patient and family, and examined the patient. The following aspects are pertinent.  Past Medical History:  Diagnosis Date  . Diabetes mellitus without complication [facility])    Social History   Socioeconomic History  . Marital status: Widowed    Spouse name: Not on file  . Number of children: Not on file  . Years of education: Not on file  . Highest education level: Not on file  Occupational History  . Not on file  Tobacco Use  . Smoking status: Never Smoker  . Smokeless tobacco: Never Used  Substance and Sexual Activity  . Alcohol use: Never  . Drug use: Never  . Sexual activity: Not Currently  Other Topics Concern  . Not on file  Social History Narrative  . Not on file   Social Determinants of Health   Financial Resource Strain:   . Difficulty of Paying Living Expenses: Not on file  Food Insecurity:   . Worried About Charity fundraiser in the Last Year: Not on file  . Ran Out of Food in the Last Year: Not on file    Transportation Needs:   . Lack of Transportation (Medical): Not on file  . Lack of Transportation (Non-Medical): Not on file  Physical Activity:   . Days of Exercise per Week: Not  on file  . Minutes of Exercise per Session: Not on file  Stress:   . Feeling of Stress : Not on file  Social Connections:   . Frequency of Communication with Friends and Family: Not on file  . Frequency of Social Gatherings with Friends and Family: Not on file  . Attends Religious Services: Not on file  . Active Member of Clubs or Organizations: Not on file  . Attends Banker Meetings: Not on file  . Marital Status: Not on file   History reviewed. No pertinent family history. Scheduled Meds: . enalapril  10 mg Oral Daily  . enoxaparin (LOVENOX) injection  40 mg Subcutaneous Daily  . feeding supplement (ENSURE ENLIVE)  237 mL Oral TID PC & HS  . gemfibrozil  600 mg Oral BID  . insulin aspart  0-9 Units Subcutaneous TID WC  . mirtazapine  15 mg Oral QHS  . montelukast  10 mg Oral QHS  . multivitamin with minerals  1 tablet Oral Daily  . pantoprazole  40 mg Oral Q1200  . pravastatin  80 mg Oral q1800   Continuous Infusions: . dextrose 5 % and 0.9% NaCl 100 mL/hr at 12/08/19 0545   PRN Meds:.magic mouthwash, ondansetron (ZOFRAN) IV Medications Prior to Admission:  Prior to Admission medications   Medication Sig Start Date End Date Taking? Authorizing Provider  enalapril (VASOTEC) 10 MG tablet Take 10 mg by mouth daily. 09/12/19  Yes [provider]  gemfibrozil (LOPID) 600 MG tablet Take 600 mg by mouth 2 (two) times daily. 11/21/19  Yes [provider]  INVOKANA 100 MG TABS tablet Take 100 mg by mouth daily. 09/12/19  Yes [provider]  metFORMIN (GLUCOPHAGE) 500 MG tablet Take 500 mg by mouth 2 (two) times daily. 10/12/19  Yes [provider]  mirtazapine (REMERON SOL-TAB) 15 MG disintegrating tablet Take 15 mg by mouth at bedtime. 11/28/19  Yes  [provider]  montelukast (SINGULAIR) 10 MG tablet Take 10 mg by mouth at bedtime. 10/12/19  Yes [provider]  pravastatin (PRAVACHOL) 80 MG tablet Take 80 mg by mouth daily. 09/18/19  Yes [provider]   No Known Allergies Review of Systems  Constitutional: Positive for activity change, appetite change and fatigue.  HENT: Negative.   Eyes: Negative.   Respiratory: Negative.   Cardiovascular: Negative.   Gastrointestinal: Negative.   Endocrine: Negative.   Genitourinary: Negative.   Musculoskeletal: Negative.   Skin: Negative.   Allergic/Immunologic: Negative.   Neurological: Negative.   Hematological: Negative.   Psychiatric/Behavioral: Negative.    Physical Exam Vitals and nursing note reviewed.  HENT:     Head: Normocephalic.     Nose: Nose normal.     Mouth/Throat:     Mouth: Mucous membranes are dry.  Cardiovascular:     Rate and Rhythm: Normal rate.     Pulses: Normal pulses.  Pulmonary:     Effort: Pulmonary effort is normal.  Abdominal:     Palpations: Abdomen is soft.  Musculoskeletal:        General: Normal range of motion.  Skin:    General: Skin is warm.     Capillary Refill: Capillary refill takes less than 2 seconds.  Neurological:     Mental Status: She is alert and oriented to person, place, and time.  Psychiatric:        Mood and Affect: Mood normal.    Vital Signs: BP (!) 172/82 (BP Location: Right Arm)  Pulse 74   Temp 98 F (36.7 C) (Oral)   Resp 18   Wt 48.2 kg   SpO2 97%  Pain Scale: 0-10   Pain Score: 0-No pain  SpO2: SpO2: 97 % O2 Device:SpO2: 97 % O2 Flow Rate: .   IO: Intake/output summary:   Intake/Output Summary (Last 24 hours) at 12/08/2019 0709 Last data filed at 12/08/2019 0600 Gross per 24 hour  Intake 700 ml  Output 2250 ml  Net -1550 ml   Baseline Weight: Weight: 48.2 kg Most recent weight: Weight: 48.2 kg     Palliative Assessment/Data: 50%   Time In: 0755 Time Out:  0905 Time Total: 70 Greater than 50%  of this time was spent counseling and coordinating care related to the above assessment and plan.  Signed by: Ernie Avena, NP   Please contact Palliative Medicine Team phone at [phone] for questions and concerns.  For individual provider: See Loretha Stapler

## 2019-12-08 NOTE — Progress Notes (Signed)
Reviewed discharge information with pt daughter Knute Neu.

## 2019-12-08 NOTE — Discharge Summary (Signed)
Physician Discharge Summary  Mckenzie Rodriguez KXF:[id number] DOB: April 23, 1931  PCP: Nicholes Rough, PA-C  Admitted from: Home Discharged to: Home  Admit date: 11/30/2019 Discharge date: 12/08/2019  Recommendations for Outpatient Follow-up:   Follow-up Information    Care, [facility] Follow up.   Specialty: Home Health Services Why: For home health services. They will contact you in 1-2 days to set up your first home appointment.  Contact information: [address] [place] [address] [phone]        Nicholes Rough, PA-C. Schedule an appointment as soon as possible for a visit in 1 week(s).   Specialty: Physician Assistant Why: To be seen with repeat labs (CBC & CMP). Contact information: [place] [address] [phone]        Consuella Lose, MD. Schedule an appointment as soon as possible for a visit in 2 week(s).   Specialty: Neurosurgery Why: Follow-up regarding small head bleed. Contact information: [address] [place] [address] [phone]            Home Health: Physical therapy Equipment/Devices: Rolling walker with 5 inch wheels  Discharge Condition: Improved and stable CODE STATUS: Full Diet recommendation: Regular diet.  Discharge Diagnoses:  Principal Problem:   COVID-19 Active Problems:   Subdural hematoma (HCC)   T2DM (type 2 diabetes mellitus) (HCC)   Decreased appetite   Unintentional weight loss   Hyponatremia   Palliative care by specialist   Goals of care, counseling/discussion   Failure to thrive in adult   Xerostomia   Muscular deconditioning   Brief Summary: 86 year old female, lives alone, PMH of type II DM, HTN, HLD, presented to ED due to 3-week history of food not tasting good, tasting better, thereby poor oral intake, approximately 10 pound weight loss, sustained fall of bed about a week PTA and bumped her head, seen by PCP few days PTA and prescribed  mirtazapine for appetite and insomnia.  Admitted for COVID-19 infection with associated decreased appetite and hypoglycemia and small subdural hematoma managed conservatively per neurosurgery advice.  Patient had several days of ongoing lack of taste, intermittent nausea and poor overall intake.  Finally in the last 24 hours, this has improved and oral intake is improving.   Assessment & Plan:   COVID-19 infection  With associated lack of taste, decreased appetite, poor oral intake, dehydration, hypoglycemia and weakness leading to fall at home and small SDH.  Not hypoxic and hence no steroids initiated.  Completed 5 days course of IV remdesivir.  Procalcitonin normal and hence no antibiotics.  Hemodynamically and respiratory status stable.  Patient had several days of ongoing lack of taste, intermittent nausea and poor overall intake.  Finally in the last 24 hours, this has improved and oral intake is improving.  Per my discussion with patient and daughter and palliative care team discussion with them today, patient and daughter declined tube feeding options and feel that she will do well at home where her daughter plans to stay with her and help her 24/7 and encourage her to eat and drink regularly.  Dehydration with hyponatremia  Due to COVID-19 infection. Resolved after IV fluid hydration.  Remains at risk for recurrence if does not have consistently improving oral intake but suspect she will do better.  Nausea  Improved.  Last received antiemetic 48 hours ago.  No vomiting.  Continue PPI at discharge.  CT abdomen did show thickening of the distal esophageal wall as can be  seen with esophagitis.  Patient did not have dysphagia.  Consider outpatient GI consultation as deemed necessary.  Possible thrush  I examined patient's oral cavity in detail on 1/14 and did not appreciate any oral thrush.  Acute kidney injury  Due to dehydration.  Resolved.    Subdural hematoma  CT head  showed small subdural hematoma without mass-effect.  Neurosurgery consulted and recommended outpatient neurosurgery follow-up.  Not on routine seizure prophylaxis.  No focal deficits.  Type II DM  Had an episode of hypoglycemia on 1/9.    No further hypoglycemic episodes.  Discontinued Invokana and Metformin at discharge until close outpatient follow-up with PCP at which time it can be determined if these medications can be resumed.  CBGs reasonably controlled in the hospital without much medications.  Physical debility/deconditioning  Due to COVID-19 infection complicating advanced age and frailty.  PT evaluated and recommended home health PT at discharge.  Mild protein calorie malnutrition  Continue Glucerna and mirtazapine.  RD input 1/14 appreciated  Anemia  Possibly related to acute illness.  Stable.  Leukopenia  Likely due to COVID-19 infection.  Resolved  Thrombocytopenia  Likely due to COVID-19 infection.  Resolved.   Hypokalemia  Replaced prior to discharge.  Magnesium normal.  Follow BMP as outpatient.  Adult failure to thrive  Due to COVID-19 infection with associated poor oral intake complicating advanced age, fraility and multiple comorbidities as above.  PMT consulted for goals of care.  Nutritional Status Nutrition Problem: Increased nutrient needs Etiology: acute illness(COVID-19 infection) Signs/Symptoms: estimated needs Interventions: Glucerna shake, MVI   Consultants:   Neurosurgery  Procedures:   None   Discharge Instructions  Discharge Instructions    Call MD for:  difficulty breathing, headache or visual disturbances   Complete by: As directed    Call MD for:  extreme fatigue   Complete by: As directed    Call MD for:  persistant dizziness or light-headedness   Complete by: As directed    Call MD for:  persistant nausea and vomiting   Complete by: As directed    Call MD for:  severe uncontrolled pain   Complete by: As  directed    Call MD for:  temperature >100.4   Complete by: As directed    Diet general   Complete by: As directed    Increase activity slowly   Complete by: As directed        Medication List    STOP taking these medications   Invokana 100 MG Tabs tablet Generic drug: canagliflozin   metFORMIN 500 MG tablet Commonly known as: GLUCOPHAGE     TAKE these medications   enalapril 10 MG tablet Commonly known as: VASOTEC Take 10 mg by mouth daily.   feeding supplement (ENSURE ENLIVE) Liqd Take 237 mLs by mouth 4 (four) times daily - after meals and at bedtime.   gemfibrozil 600 MG tablet Commonly known as: LOPID Take 600 mg by mouth 2 (two) times daily.   mirtazapine 15 MG disintegrating tablet Commonly known as: REMERON SOL-TAB Take 15 mg by mouth at bedtime.   montelukast 10 MG tablet Commonly known as: SINGULAIR Take 10 mg by mouth at bedtime.   multivitamin with minerals Tabs tablet Take 1 tablet by mouth daily. Start taking on: December 09, 2019   pantoprazole 40 MG tablet Commonly known as: PROTONIX Take 1 tablet (40 mg total) by mouth daily at 12 noon. Start taking on: December 09, 2019   pravastatin 80 MG tablet Commonly  known as: PRAVACHOL Take 80 mg by mouth daily.      No Known Allergies    Procedures/Studies: Chest Xray 2 View  Result Date: 11/30/2019 CLINICAL DATA:  Unintentional weight loss EXAM: CHEST - 2 VIEW COMPARISON:  None. FINDINGS: Cardiac shadow is at the upper limits of normal in size. Aortic calcifications are noted. The lungs are clear. No effusion is noted. No bony abnormality is seen. IMPRESSION: No acute abnormality noted. Aortic Atherosclerosis (ICD10-I70.0). Electronically Signed   By: Alcide Clever M.D.   On: 11/30/2019 23:13   CT HEAD WO CONTRAST  Result Date: 12/01/2019 CLINICAL DATA:  Subdural hemorrhage. EXAM: CT HEAD WITHOUT CONTRAST TECHNIQUE: Contiguous axial images were obtained from the base of the skull through the  vertex without intravenous contrast. COMPARISON:  11/30/2019 FINDINGS: Brain: Small focal subdural hematoma identified in the left high frontal parietal region is stable in the interval no evidence for interval rebleeding. There is no evidence for hydrocephalus, mass lesion, or abnormal extra-axial fluid collection. No definite CT evidence for acute infarction. Patchy low attenuation in the deep hemispheric and periventricular white matter is nonspecific, but likely reflects chronic microvascular ischemic demyelination. Diffuse loss of parenchymal volume is consistent with atrophy. Prominence of the lateral ventricles is stable and likely related to central atrophy. Vascular: No hyperdense vessel or unexpected calcification. Skull: Insert normal bone Sinuses/Orbits: The visualized paranasal sinuses and mastoid air cells are clear. Other: None. IMPRESSION: 1. Stable small left frontoparietal subdural hematoma. No evidence for interval rebleeding. 2. Atrophy with chronic small vessel white matter ischemic disease. Electronically Signed   By: Kennith Center M.D.   On: 12/01/2019 07:41   CT Head Wo Contrast  Result Date: 11/30/2019 CLINICAL DATA:  Ataxia. EXAM: CT HEAD WITHOUT CONTRAST TECHNIQUE: Contiguous axial images were obtained from the base of the skull through the vertex without intravenous contrast. COMPARISON:  None. FINDINGS: Brain: A small acute subdural bleed is seen along the left frontal lobe. This measures approximately 6 mm in maximum thickness (axial CT images 18 through 24, CT series number 2). There is very mild mass effect on the adjacent sulci. No midline shift is seen. There is mild cerebral atrophy with widening of the extra-axial spaces and ventricular dilatation. There are areas of decreased attenuation within the white matter tracts of the supratentorial brain, consistent with microvascular disease changes. Vascular: No hyperdense vessel or unexpected calcification. Skull: Normal. Negative  for fracture or focal lesion. Sinuses/Orbits: No acute finding. Other: None. IMPRESSION: 1. Small acute left frontal lobe subdural bleed. 2. 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 Electronically Signed   By: Aram Candela M.D.   On: 11/30/2019 18:18   CT ABDOMEN PELVIS W CONTRAST  Result Date: 11/30/2019 CLINICAL DATA:  Feeling sick for 3 weeks. EXAM: CT ABDOMEN AND PELVIS WITH CONTRAST TECHNIQUE: Multidetector CT imaging of the abdomen and pelvis was performed using the standard protocol following bolus administration of intravenous contrast. CONTRAST:  OMNIPAQUE IOHEXOL 350 MG/ML SOLN COMPARISON:  None. FINDINGS: Lower chest: Hazy left lower lobe airspace disease which may be secondary to an infectious or inflammatory etiology. Distal esophageal wall thickening as can be seen with esophagitis. Hepatobiliary: Diffuse low attenuation of the liver as can be seen with hepatic steatosis. Normal gallbladder. No hepatic mass. Pancreas: Unremarkable. No pancreatic ductal dilatation or surrounding inflammatory changes. Spleen: Normal in size without focal abnormality. Adrenals/Urinary Tract: Normal adrenal glands. 4 cm hypodense, fluid attenuating left renal mass. Bilateral nephrolithiasis. No ureterolithiasis.  Normal bladder. Stomach/Bowel: Relative thickening of the wall of the pylorus likely reflecting peristalsis. No bowel dilatation or bowel wall thickening. No pneumatosis, pneumoperitoneum or portal venous gas. Diverticulosis without evidence of diverticulitis. Vascular/Lymphatic: Normal caliber abdominal aorta with mild atherosclerosis. No lymphadenopathy. Reproductive: Uterus and bilateral adnexa are unremarkable. Other: No abdominal wall hernia or abnormality. No abdominopelvic ascites. Musculoskeletal: No acute osseous abnormality. No aggressive osseous lesion. Severe degenerative disease with disc height loss at L3-4 with reactive endplate sclerosis.  Bilateral facet arthropathy of the lumbar spine. Chronic L3 vertebral body compression fracture. IMPRESSION: 1. No acute abdominal or pelvic pathology. 2. Hazy left lower lobe airspace disease which may be secondary to an infectious or inflammatory etiology. 3. Hepatic steatosis 4. Thickening of the distal esophageal wall as can be seen with esophagitis. Electronically Signed   By: Elige Ko   On: 11/30/2019 18:29      Subjective: Patient reports feeling better.  Patient and daughter both indicated that she has been eating a little bit during each meals.  As per RN, patient ate a sausage and half of her Glucerna at breakfast.  No nausea or vomiting reported.  Still has bitter taste but seems to be slowly improving.  No chest pain, dyspnea, dizziness or lightheadedness reported.  Discharge Exam:  Vitals:   12/07/19 0800 12/07/19 1641 12/07/19 2114 12/08/19 0740  BP: (!) 139/57 110/69 (!) 172/82 (!) 148/78  Pulse: 69 69 74 72  Resp:   18 16  Temp: 97.8 F (36.6 C) (!) 97.2 F (36.2 C) 98 F (36.7 C) 98.2 F (36.8 C)  TempSrc: Oral Oral Oral Oral  SpO2: 96% 99% 97% 97%  Weight:        General exam: Elderly female, moderately built and nourished, sitting up comfortably in bed getting ready to eat her breakfast.  Asking if her breakfast can be heated.  This is the best she has looked in the last 4 days caring for her. Respiratory system: Clear to auscultation.  No increased work of breathing. Cardiovascular system: S1 & S2 heard, RRR. No JVD, murmurs, rubs, gallops or clicks. No pedal edema.  Stable without change. Gastrointestinal system: Abdomen is nondistended, soft and nontender.  No organomegaly or masses appreciated.  Normal bowel sounds heard. Central nervous system: Alert and oriented. No focal neurological deficits. Extremities: Symmetric 5 x 5 power. Skin: No rashes, lesions or ulcers Psychiatry: Judgement and insight appear normal. Mood & affect flat.    The results of  significant diagnostics from this hospitalization (including imaging, microbiology, ancillary and laboratory) are listed below for reference.     Microbiology: Recent Results (from the past 240 hour(s))  SARS CORONAVIRUS 2 (TAT 6-24 HRS) Nasopharyngeal Nasopharyngeal Swab     Status: Abnormal   Collection Time: 11/30/19  7:17 PM   Specimen: Nasopharyngeal Swab  Result Value Ref Range Status   SARS Coronavirus 2 POSITIVE (A) NEGATIVE Final    Comment: RESULT CALLED TO, READ BACK BY AND VERIFIED WITH: RN BRENDA NICKELSON AT 0729 BY MESSAN HOUEGNIFIO ON 12/01/2019 (NOTE) SARS-CoV-2 target nucleic acids are DETECTED. The SARS-CoV-2 RNA is generally detectable in upper and lower respiratory specimens during the acute phase of infection. Positive results are indicative of the presence of SARS-CoV-2 RNA. Clinical correlation with patient history and other diagnostic information is  necessary to determine patient infection status. Positive results do not rule out bacterial infection or co-infection with other viruses.  The expected result is Negative. Fact Sheet for Patients: HairSlick.no Fact Sheet  for Healthcare Providers: quierodirigir.com This test is not yet approved or cleared by the [place] and  has been authorized for detection and/or diagnosis of SARS-CoV-2 by FDA under an Emergency Use Authorization (EUA). This EUA will remain  in effect (meaning this  test can be used) for the duration of the COVID-19 declaration under Section 564(b)(1) of the Act, 21 U.S.C. section 360bbb-3(b)(1), unless the authorization is terminated or revoked sooner. Performed at [facility], [address], [place], [place] [address]      Labs: CBC: Recent Labs  Lab 12/02/19 0818 12/02/19 0818 12/03/19 0431 12/04/19 0435 12/05/19 0345 12/06/19 0732 12/07/19 0614  WBC 3.0*   < > 2.5* 2.8* 3.2* 3.2* 4.1  NEUTROABS 2.0  --  1.3*  1.6* 2.3  --   --   HGB 12.7   < > 12.3 11.8* 12.1 11.9* 11.7*  HCT 37.8   < > 37.3 34.8* 35.1* 34.8* 34.0*  MCV 87.7   < > 87.1 86.8 86.2 84.9 84.0  PLT 130*   < > 148* 162 163 193 206   < > = values in this interval not displayed.    Basic Metabolic Panel: Recent Labs  Lab 12/03/19 0431 12/03/19 0431 12/04/19 0435 12/05/19 0345 12/06/19 0732 12/07/19 0614 12/07/19 1154 12/08/19 0753  NA 136  --  136 139  --  137  --  136  K 3.7  --  3.5 3.5  --  3.0*  --  3.1*  CL 109  --  110 111  --  110  --  107  CO2 20*  --  18* 21*  --  21*  --  21*  GLUCOSE 106*   < > 115* 106* 110* 137*  --  134*  BUN 13  --  11 11  --  8  --  5*  CREATININE 0.56  --  0.41* 0.51  --  0.56  --  0.40*  CALCIUM 8.1*  --  7.9* 8.0*  --  7.8*  --  7.9*  MG  --   --   --   --   --   --  1.7  --    < > = values in this interval not displayed.    Liver Function Tests: Recent Labs  Lab 12/02/19 0818 12/03/19 0431 12/04/19 0435 12/05/19 0345 12/07/19 0614  AST 23 23 22 22 21   ALT 10 11 11 12 12   ALKPHOS 47 44 42 49 50  BILITOT 1.0 0.8 0.8 0.8 0.5  PROT 6.7 6.2* 6.1* 5.8* 5.7*  ALBUMIN 3.1* 2.8* 2.6* 2.6* 2.4*    CBG: Recent Labs  Lab 12/07/19 1132 12/07/19 1644 12/07/19 2112 12/08/19 0737 12/08/19 1242  GLUCAP 161* 138* 113* 128* 152*     Urinalysis    Component Value Date/Time   COLORURINE YELLOW 11/30/2019 1242   APPEARANCEUR HAZY (A) 11/30/2019 1242   LABSPEC 1.016 11/30/2019 1242   PHURINE 5.0 11/30/2019 1242   GLUCOSEU >=500 (A) 11/30/2019 1242   HGBUR NEGATIVE 11/30/2019 1242   BILIRUBINUR NEGATIVE 11/30/2019 1242   KETONESUR 5 (A) 11/30/2019 1242   PROTEINUR NEGATIVE 11/30/2019 1242   NITRITE NEGATIVE 11/30/2019 1242   LEUKOCYTESUR NEGATIVE 11/30/2019 1242    I discussed in detail with patient's daughter, updated care and answered questions.  Time coordinating discharge: 25 minutes  SIGNED:  01/28/2020, MD, FACP, [facility]. [facility]  To contact the  attending provider between 7A-7P or the covering provider during  after hours 7P-7A, please log into the web site [email or web address] and access using universal [place] password for that web site. If you do not have the password, please call the hospital operator.

## 2019-12-19 ENCOUNTER — Other Ambulatory Visit: Payer: Self-pay | Admitting: Physician Assistant

## 2019-12-19 ENCOUNTER — Ambulatory Visit
Admission: RE | Admit: 2019-12-19 | Discharge: 2019-12-19 | Disposition: A | Discharge status: Home / Self Care | Payer: Medicare HMO | Source: Ambulatory Visit | Attending: Physician Assistant | Admitting: Physician Assistant

## 2019-12-19 DIAGNOSIS — I6200 Nontraumatic subdural hemorrhage, unspecified: Secondary | ICD-10-CM

## 2020-01-24 ENCOUNTER — Other Ambulatory Visit: Payer: Self-pay | Admitting: Physician Assistant

## 2020-01-24 DIAGNOSIS — I6200 Nontraumatic subdural hemorrhage, unspecified: Secondary | ICD-10-CM

## 2020-11-29 IMAGING — CR DG CHEST 2V
2 series · 2 of 2 positions shown · non-contrast
Comparison: None.

CLINICAL DATA: Unintentional weight loss

EXAM:
CHEST - 2 VIEW

[chest lat]
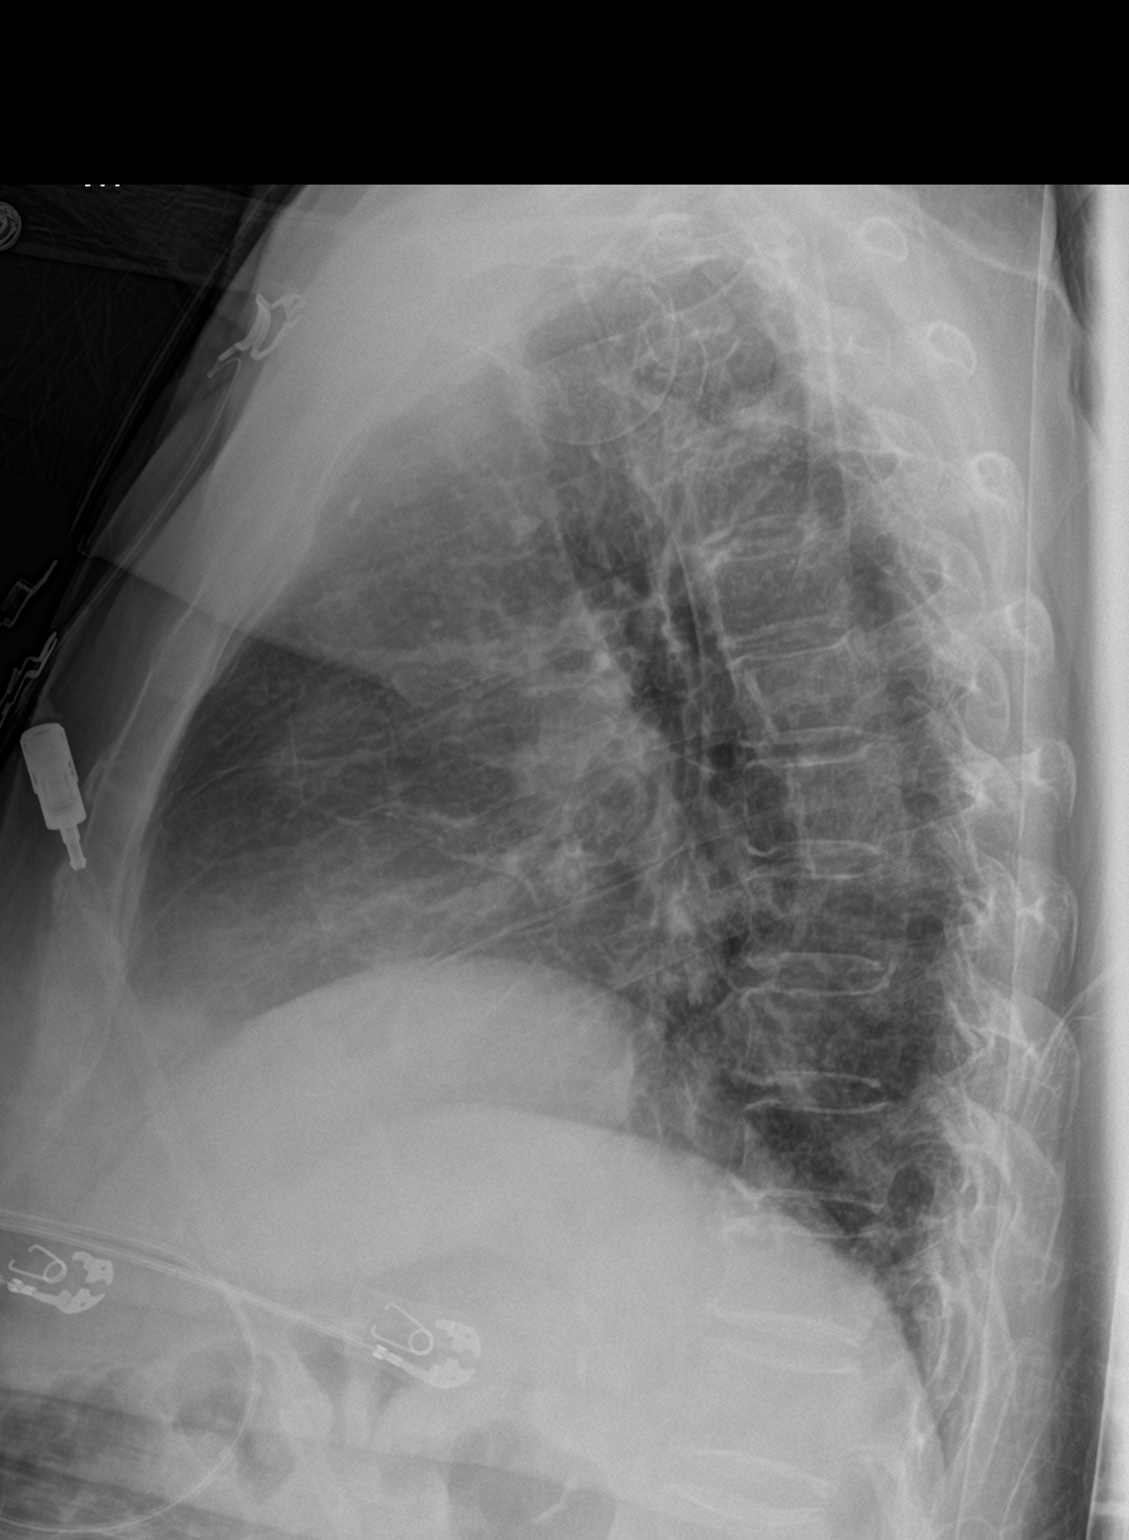

[chest ap]
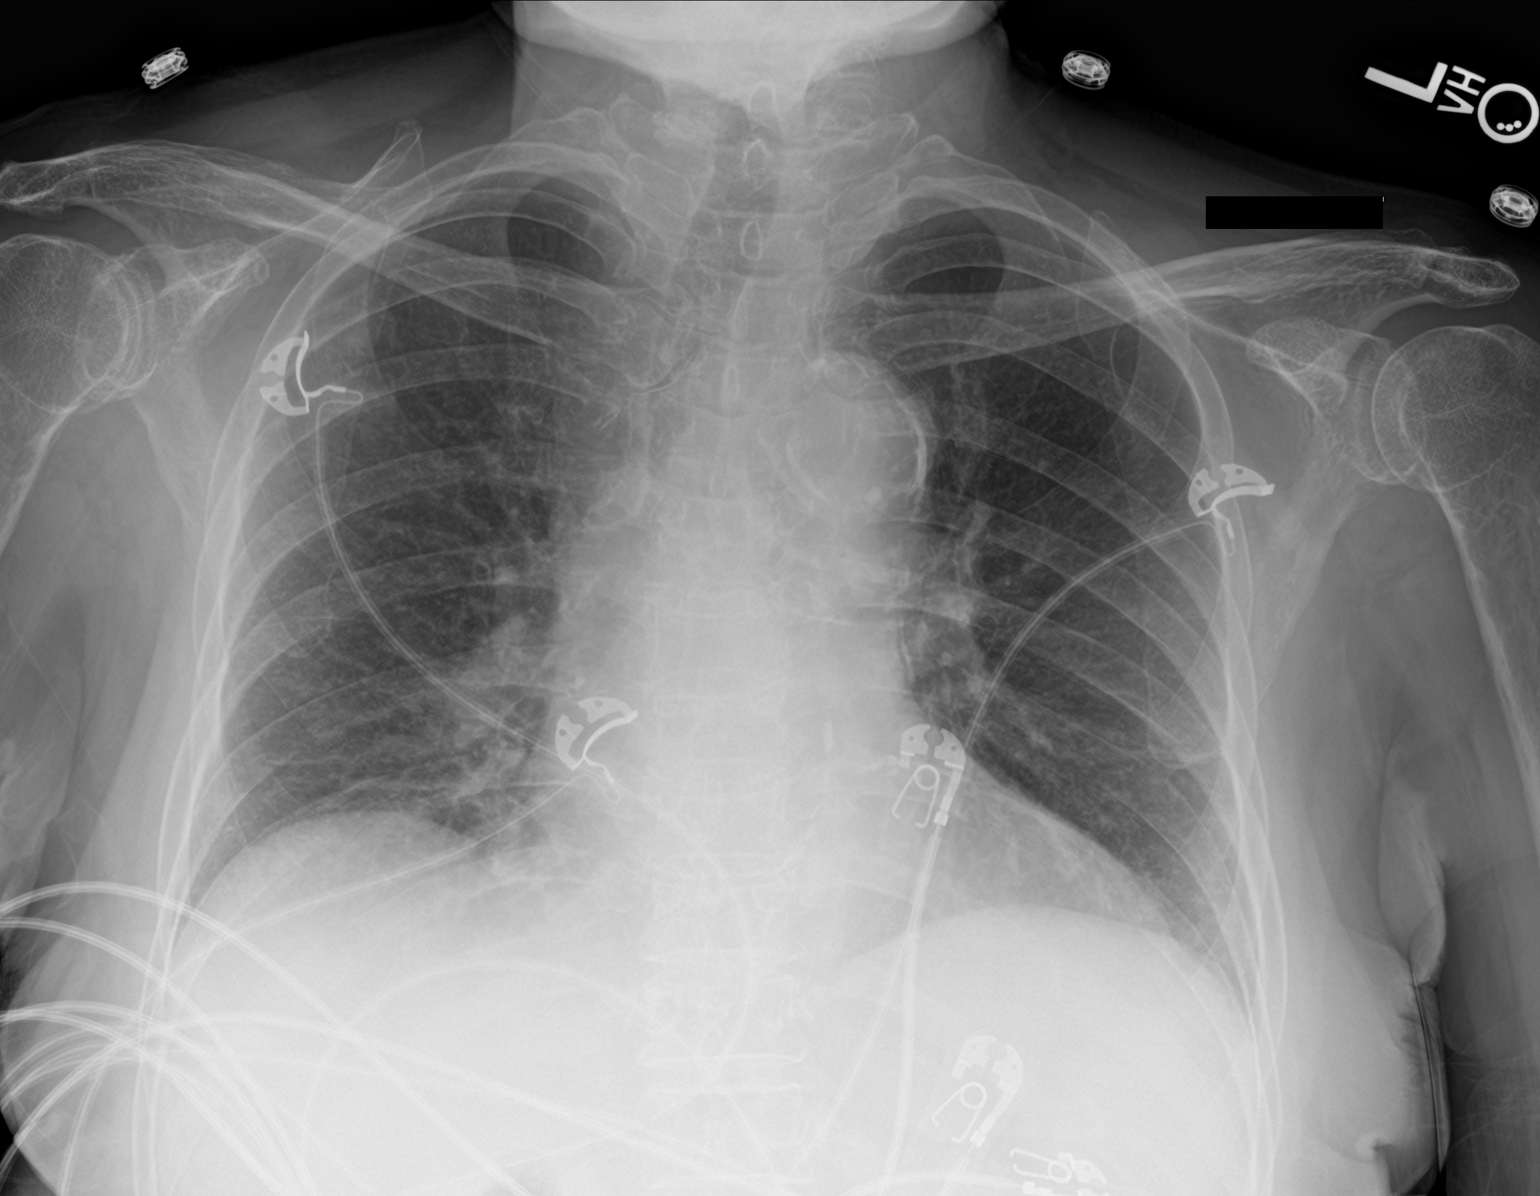

[2 of 2 positions shown; findings below may reference images not displayed]

FINDINGS: Cardiac shadow is at the upper limits of normal in size. Aortic
calcifications are noted. The lungs are clear. No effusion is noted.
No bony abnormality is seen.
IMPRESSION: No acute abnormality noted.

Aortic Atherosclerosis ([id number]).

## 2020-11-29 IMAGING — CT CT HEAD W/O CM
4 series · 16 of 47 positions shown, 18 images · non-contrast
Comparison: None.

CLINICAL DATA: Ataxia.

EXAM:
CT HEAD WITHOUT CONTRAST
TECHNIQUE: Contiguous axial images were obtained from the base of the skull
through the vertex without intravenous contrast.

[Series 2: head without · axial · non-contrast · 0.42mm/px · z∈[-106,+14]mm · 7 of 33 slices shown, 9 images]
[im 5/33  brain]
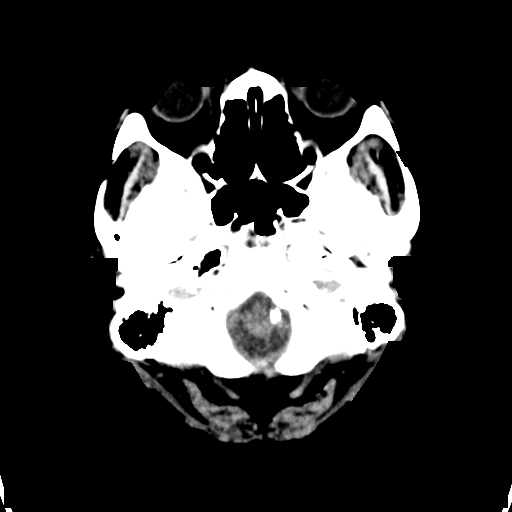
[im 5/33  bone]
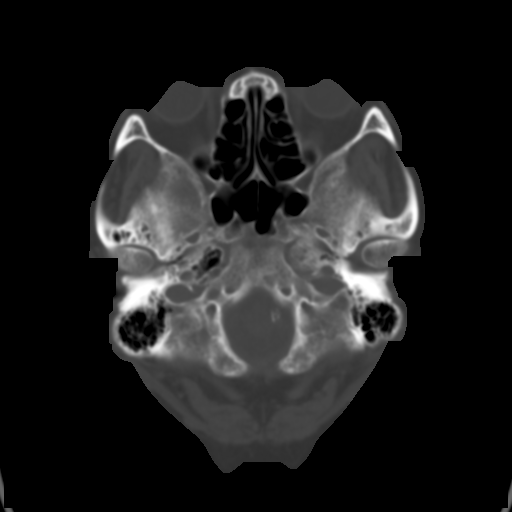
[im 9/33  brain]
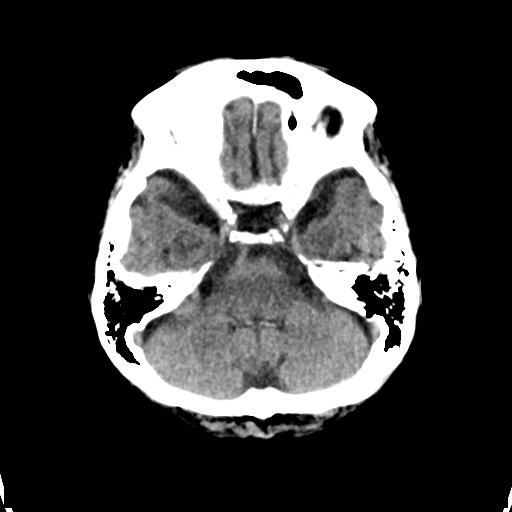
[im 13/33  brain]
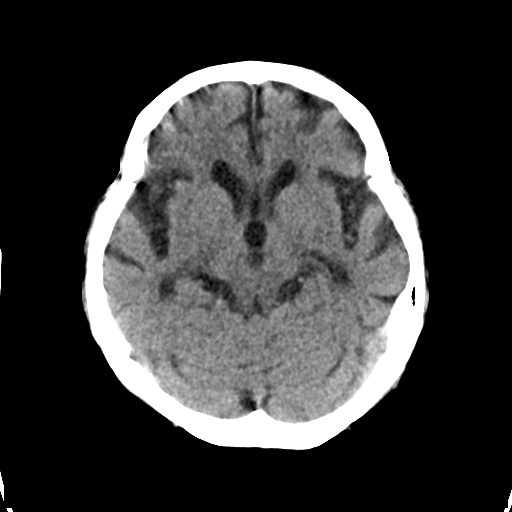
[im 17/33  brain]
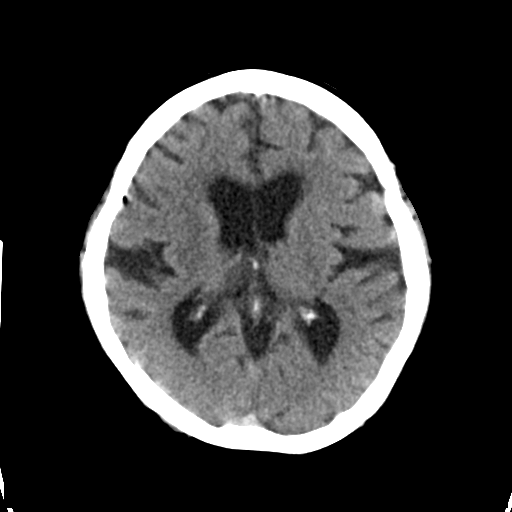
[im 21/33  brain]
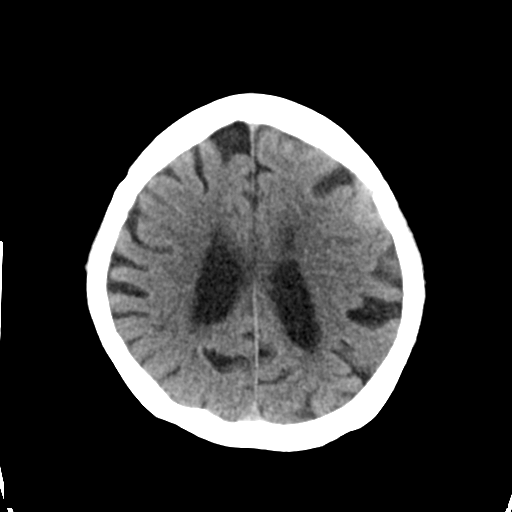
[im 21/33  bone]
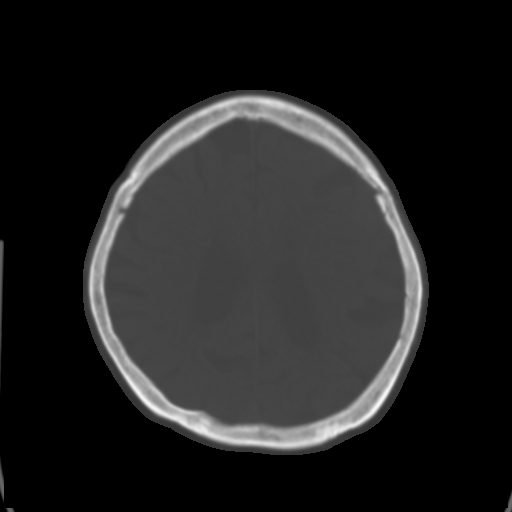
[im 25/33  brain]
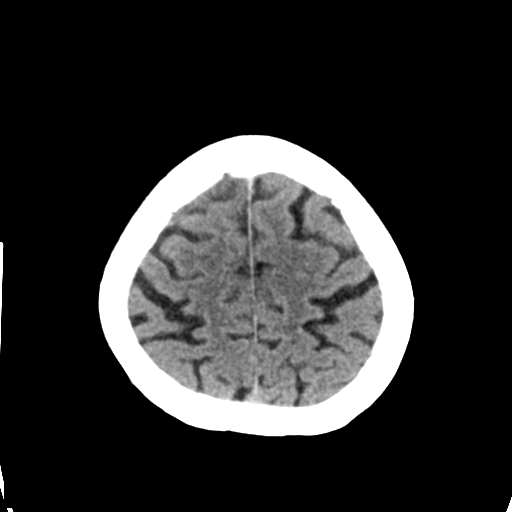
[im 29/33  brain]
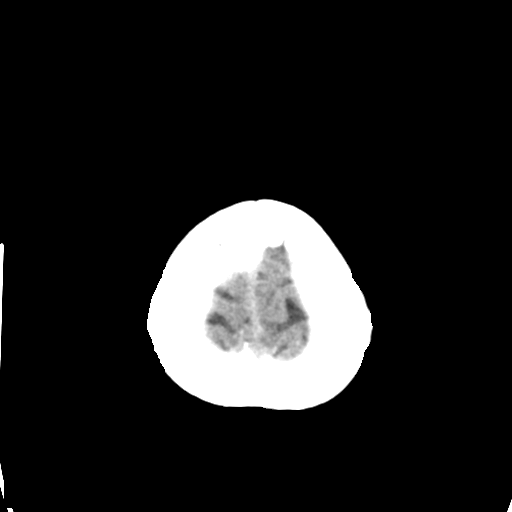

[Series 3: head bone · axial · 0.42mm/px · z∈[-110,-78]mm · 3 of 81 slices shown]
[im 9/81  bone]
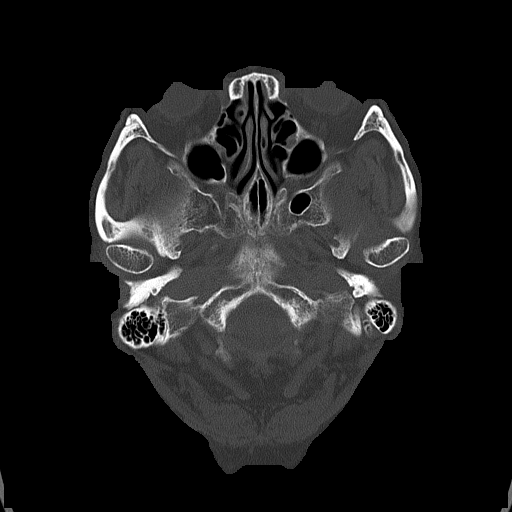
[im 17/81  bone]
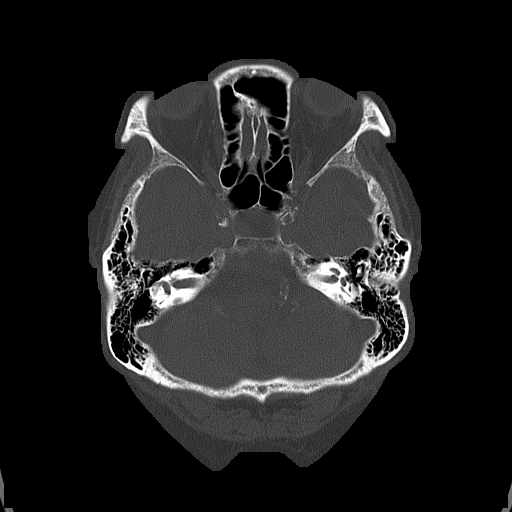
[im 25/81  bone]
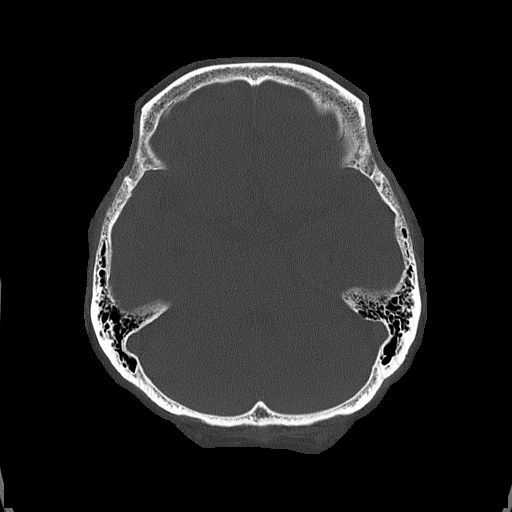

[Series 4: head without cor · coronal · non-contrast · 0.32mm/px · 3 of 69 slices shown]
[im 23/69  brain]
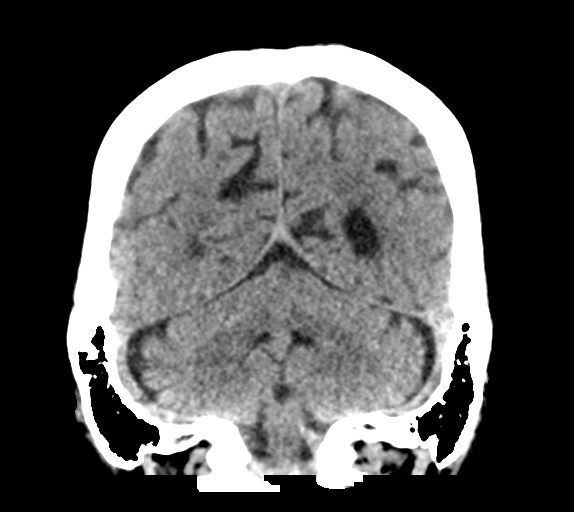
[im 31/69  brain]
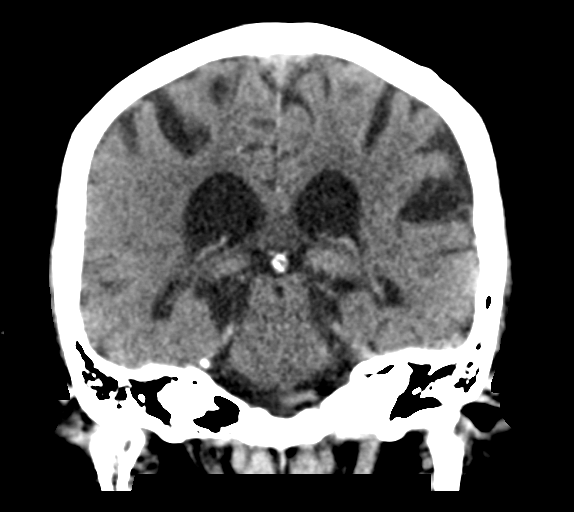
[im 38/69  brain]
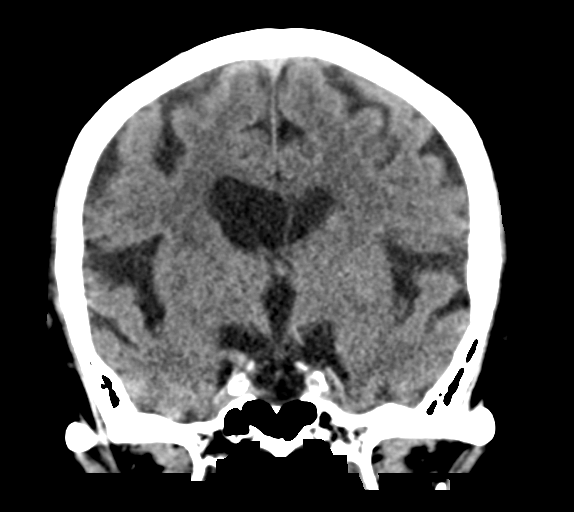

[Series 5: head without sag · sagittal · non-contrast · 0.32mm/px · 3 of 64 slices shown]
[im 22/64  brain]
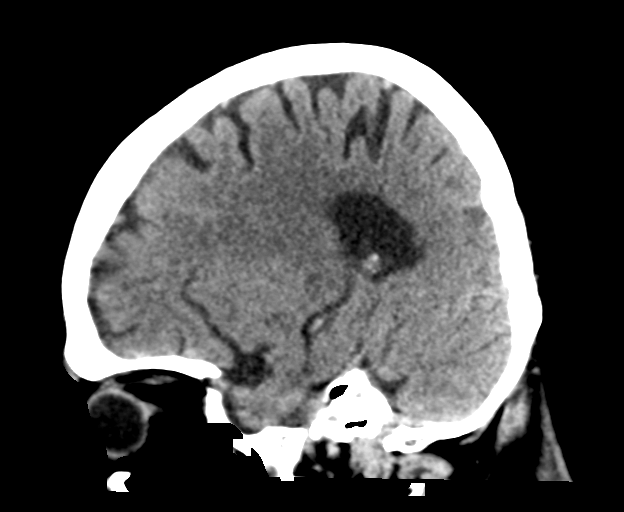
[im 32/64  brain]
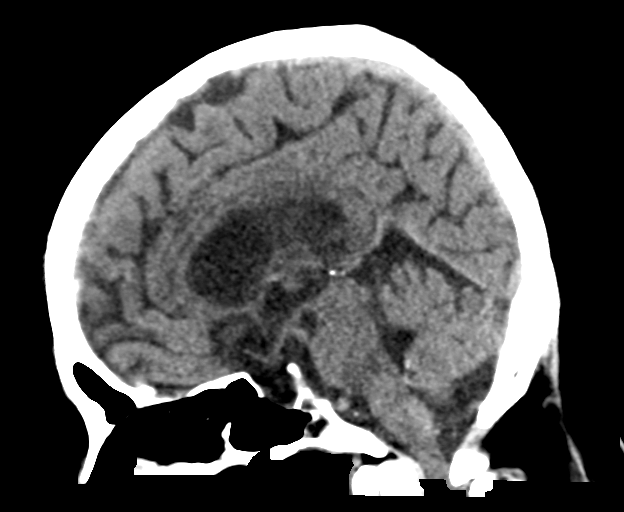
[im 43/64  brain]
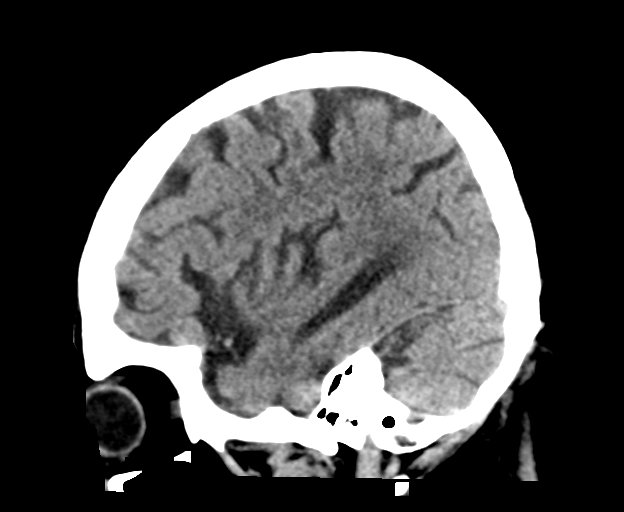

[16 of 47 positions shown; findings below may reference images not displayed]

FINDINGS: Brain: A small acute subdural bleed is seen along the left frontal
lobe. This measures approximately 6 mm in maximum thickness (axial
CT images 18 through 24, CT series number 2). There is very mild
mass effect on the adjacent sulci. No midline shift is seen.

There is mild cerebral atrophy with widening of the extra-axial
spaces and ventricular dilatation.
There are areas of decreased attenuation within the white matter
tracts of the supratentorial brain, consistent with microvascular
disease changes.

Vascular: No hyperdense vessel or unexpected calcification.

Skull: Normal. Negative for fracture or focal lesion.

Sinuses/Orbits: No acute finding.

Other: None.
IMPRESSION: 1. Small acute left frontal lobe subdural bleed.
2. Cerebral atrophy with microvascular disease changes of the
supratentorial brain.
